# Patient Record
Sex: Male | Born: 1960 | Race: Black or African American | Hispanic: No | Marital: Married | Smoking: Never smoker
Health system: Southern US, Community
[De-identification: ages and names within clinical notes are randomized; demographics above are authoritative.]

## PROBLEM LIST (undated history)

## (undated) DIAGNOSIS — F32A Depression, unspecified: Secondary | ICD-10-CM

## (undated) DIAGNOSIS — F329 Major depressive disorder, single episode, unspecified: Secondary | ICD-10-CM

## (undated) DIAGNOSIS — G8929 Other chronic pain: Secondary | ICD-10-CM

## (undated) DIAGNOSIS — K5900 Constipation, unspecified: Secondary | ICD-10-CM

## (undated) DIAGNOSIS — M549 Dorsalgia, unspecified: Secondary | ICD-10-CM

## (undated) DIAGNOSIS — G43909 Migraine, unspecified, not intractable, without status migrainosus: Secondary | ICD-10-CM

## (undated) DIAGNOSIS — R591 Generalized enlarged lymph nodes: Principal | ICD-10-CM

## (undated) DIAGNOSIS — K219 Gastro-esophageal reflux disease without esophagitis: Secondary | ICD-10-CM

## (undated) HISTORY — DX: Generalized enlarged lymph nodes: R59.1

## (undated) HISTORY — PX: NO PAST SURGERIES: SHX2092

## (undated) HISTORY — DX: Migraine, unspecified, not intractable, without status migrainosus: G43.909

---

## 2012-06-19 ENCOUNTER — Encounter: Payer: Self-pay | Admitting: Family Medicine

## 2012-06-19 ENCOUNTER — Ambulatory Visit (INDEPENDENT_AMBULATORY_CARE_PROVIDER_SITE_OTHER): Payer: PRIVATE HEALTH INSURANCE | Admitting: Family Medicine

## 2012-06-19 VITALS — BP 126/74 | HR 80 | Resp 18 | Ht 69.0 in | Wt 151.1 lb

## 2012-06-19 DIAGNOSIS — Z13220 Encounter for screening for lipoid disorders: Secondary | ICD-10-CM

## 2012-06-19 DIAGNOSIS — G43909 Migraine, unspecified, not intractable, without status migrainosus: Secondary | ICD-10-CM

## 2012-06-19 DIAGNOSIS — N289 Disorder of kidney and ureter, unspecified: Secondary | ICD-10-CM

## 2012-06-19 NOTE — Patient Instructions (Signed)
MRI to be set up Labs to be done fasting- we will call with lab results I will get the records from your previous specialist  Schedule Physical Exam in 6 month

## 2012-06-19 NOTE — Progress Notes (Signed)
  Subjective:    Patient ID: Brandon [name], male    DOB: 05/29/1961, 51 y.o.   MRN: [id number]  HPI Pt here to establish care, no previous PCP Medications and history reviewed Patient has a history of migraine disorder. He also suffers with chronic neck and back pain. [facility] is [facility]. He has tried multiple CAM modalities as well as trigger point injections, epidural injections into the neck and lower back and currently on medications. He was trying to get Botox injections however this was not approved and he was asked to have an MRI of the brain which she has not had. His headaches are still uncontrolled and he finds this to be very worrisome. He feels a day or worsening and they are interfering with his life. His back pain was treated by Dr. Eduard Clos, neck pain by orthopedic surgeon Dr. Shon Baton He has not had lab studies   Review of Systems  GEN- denies fatigue, fever, weight loss,weakness, recent illness HEENT- denies eye drainage, change in vision, nasal discharge, CVS- denies chest pain, palpitations RESP- denies SOB, cough, wheeze ABD- denies N/V, change in stools, abd pain GU- denies dysuria, hematuria, dribbling, incontinence MSK- +joint pain, muscle aches, injury Neuro- + headache, dizziness, syncope, seizure activity      Objective:   Physical Exam GEN- NAD, alert and oriented x3 HEENT- PERRL, EOMI, non injected sclera, pink conjunctiva, MMM, oropharynx clear Neck- Supple,  CVS- RRR, no murmur RESP-CTAB ABD-NABS,soft,NT,ND EXT- No edema Pulses- Radial, DP- 2+ Neuro- CNII-XII grossly in tact, no focal deficits       Assessment & Plan:

## 2012-06-23 DIAGNOSIS — G43909 Migraine, unspecified, not intractable, without status migrainosus: Secondary | ICD-10-CM | POA: Insufficient documentation

## 2012-06-23 LAB — CBC WITH DIFFERENTIAL/PLATELET
Basophils Absolute: 0 10*3/uL (ref 0.0–0.1)
Lymphocytes Relative: 51 % — ABNORMAL HIGH (ref 12–46)
Neutro Abs: 1.3 10*3/uL — ABNORMAL LOW (ref 1.7–7.7)
Neutrophils Relative %: 39 % — ABNORMAL LOW (ref 43–77)
Platelets: 308 10*3/uL (ref 150–400)
RDW: 11.8 % (ref 11.5–15.5)
WBC: 3.3 10*3/uL — ABNORMAL LOW (ref 4.0–10.5)

## 2012-06-23 LAB — COMPREHENSIVE METABOLIC PANEL
ALT: 21 U/L (ref 0–53)
Albumin: 4.9 g/dL (ref 3.5–5.2)
CO2: 26 mEq/L (ref 19–32)
Calcium: 10 mg/dL (ref 8.4–10.5)
Chloride: 106 mEq/L (ref 96–112)
Creat: 1.55 mg/dL — ABNORMAL HIGH (ref 0.50–1.35)
Sodium: 139 mEq/L (ref 135–145)
Total Protein: 7.7 g/dL (ref 6.0–8.3)

## 2012-06-23 LAB — LIPID PANEL
Cholesterol: 225 mg/dL — ABNORMAL HIGH (ref 0–200)
Triglycerides: 93 mg/dL (ref <150)

## 2012-06-23 NOTE — Assessment & Plan Note (Signed)
I will obtain records from the headache clinic. He has not been followed by primary care physician has not had any blood work done. MRI of the brain will also be done as needed for his insurance purposes for further treatment Records to be obtained

## 2012-07-24 ENCOUNTER — Telehealth: Payer: Self-pay

## 2012-07-24 LAB — BASIC METABOLIC PANEL
BUN: 13 mg/dL (ref 6–23)
CO2: 27 mEq/L (ref 19–32)
Calcium: 9.8 mg/dL (ref 8.4–10.5)
Glucose, Bld: 79 mg/dL (ref 70–99)
Potassium: 4.3 mEq/L (ref 3.5–5.3)

## 2012-07-24 LAB — CBC
HCT: 47.2 % (ref 39.0–52.0)
Hemoglobin: 16.5 g/dL (ref 13.0–17.0)
MCH: 31 pg (ref 26.0–34.0)
MCHC: 35 g/dL (ref 30.0–36.0)
RBC: 5.33 MIL/uL (ref 4.22–5.81)

## 2012-07-24 NOTE — Addendum Note (Signed)
Addended by: Kandis Fantasia B on: 07/24/2012 03:57 PM   Modules accepted: Orders

## 2012-07-25 LAB — URINALYSIS
Glucose, UA: NEGATIVE mg/dL
Hgb urine dipstick: NEGATIVE
Ketones, ur: NEGATIVE mg/dL
Leukocytes, UA: NEGATIVE
Protein, ur: NEGATIVE mg/dL

## 2012-08-14 ENCOUNTER — Other Ambulatory Visit: Payer: Self-pay | Admitting: Family Medicine

## 2012-08-14 DIAGNOSIS — G43909 Migraine, unspecified, not intractable, without status migrainosus: Secondary | ICD-10-CM

## 2012-08-15 NOTE — Telephone Encounter (Signed)
Scheduled for 10-23

## 2012-08-16 ENCOUNTER — Ambulatory Visit (HOSPITAL_COMMUNITY)
Admission: RE | Admit: 2012-08-16 | Discharge: 2012-08-16 | Disposition: A | Discharge status: Home / Self Care | Payer: PRIVATE HEALTH INSURANCE | Source: Ambulatory Visit | Attending: Family Medicine | Admitting: Family Medicine

## 2012-08-16 DIAGNOSIS — R51 Headache: Secondary | ICD-10-CM | POA: Insufficient documentation

## 2012-08-16 DIAGNOSIS — G43909 Migraine, unspecified, not intractable, without status migrainosus: Secondary | ICD-10-CM

## 2012-10-16 ENCOUNTER — Telehealth: Payer: Self-pay | Admitting: Family Medicine

## 2012-10-16 NOTE — Telephone Encounter (Signed)
Called pt and no answer.  

## 2012-10-19 ENCOUNTER — Encounter: Payer: Self-pay | Admitting: Family Medicine

## 2012-10-19 ENCOUNTER — Ambulatory Visit (INDEPENDENT_AMBULATORY_CARE_PROVIDER_SITE_OTHER): Payer: PRIVATE HEALTH INSURANCE | Admitting: Family Medicine

## 2012-10-19 VITALS — BP 104/82 | HR 69 | Resp 16 | Wt 144.1 lb

## 2012-10-19 DIAGNOSIS — E785 Hyperlipidemia, unspecified: Secondary | ICD-10-CM

## 2012-10-19 DIAGNOSIS — Z0000 Encounter for general adult medical examination without abnormal findings: Secondary | ICD-10-CM

## 2012-10-19 DIAGNOSIS — R3129 Other microscopic hematuria: Secondary | ICD-10-CM

## 2012-10-19 DIAGNOSIS — R109 Unspecified abdominal pain: Secondary | ICD-10-CM

## 2012-10-19 DIAGNOSIS — Z1211 Encounter for screening for malignant neoplasm of colon: Secondary | ICD-10-CM

## 2012-10-19 DIAGNOSIS — N289 Disorder of kidney and ureter, unspecified: Secondary | ICD-10-CM

## 2012-10-19 DIAGNOSIS — R319 Hematuria, unspecified: Secondary | ICD-10-CM

## 2012-10-19 DIAGNOSIS — Z125 Encounter for screening for malignant neoplasm of prostate: Secondary | ICD-10-CM

## 2012-10-19 LAB — COMPREHENSIVE METABOLIC PANEL
Albumin: 5.1 g/dL (ref 3.5–5.2)
BUN: 9 mg/dL (ref 6–23)
Calcium: 10.6 mg/dL — ABNORMAL HIGH (ref 8.4–10.5)
Chloride: 101 mEq/L (ref 96–112)
Glucose, Bld: 82 mg/dL (ref 70–99)
Potassium: 4.3 mEq/L (ref 3.5–5.3)
Total Protein: 8 g/dL (ref 6.0–8.3)

## 2012-10-19 LAB — CBC
MCHC: 35.1 g/dL (ref 30.0–36.0)
MCV: 89.1 fL (ref 78.0–100.0)
Platelets: 323 10*3/uL (ref 150–400)
RDW: 12 % (ref 11.5–15.5)
WBC: 4.7 10*3/uL (ref 4.0–10.5)

## 2012-10-19 LAB — POCT URINALYSIS DIPSTICK
Glucose, UA: NEGATIVE
Nitrite, UA: NEGATIVE
Protein, UA: NEGATIVE
Urobilinogen, UA: 0.2

## 2012-10-19 LAB — ESTIMATED GFR: GFR, Est Non African American: 64 mL/min

## 2012-10-19 MED ORDER — TRAMADOL HCL 50 MG PO TABS: 50.0000 mg | ORAL_TABLET | Freq: Three times a day (TID) | ORAL | Status: DC | PRN

## 2012-10-19 NOTE — Progress Notes (Signed)
  Subjective:    Patient ID: Brandon [name], male    DOB: 30-May-1961, 50 y.o.   MRN: [id number]  HPI Pt here for CPE. Abdominal pain on and off past week, felt mostly in lower quadrants, denies N/V, diarrhea, has not slept well because of severe pain at night. Denies dysuria. No new medications, [facility] still TDAP 2012   Review of Systems  GEN- denies fatigue, fever, weight loss,weakness, recent illness HEENT- denies eye drainage, change in vision, nasal discharge, CVS- denies chest pain, palpitations RESP- denies SOB, cough, wheeze ABD- denies N/V, change in stools, +abd pain GU- denies dysuria, hematuria, dribbling, incontinence MSK- denies joint pain, muscle aches, injury Neuro- denies headache, dizziness, syncope, seizure activity      Objective:   Physical Exam GEN- NAD, alert and oriented x3 HEENT- PERRL, EOMI, non injected sclera, pink conjunctiva, MMM, oropharynx clear Neck- Supple, no thryomegaly CVS- RRR, no murmur RESP-CTAB ABD-NABS,soft,TTP lower quadrants, no rebound, no guarding,ND GU- normal external genitalia, no hernia seen, FOBT neg, normal rectal tone, smooth prostate EXT- No edema Pulses- Radial, DP- 2+ Psych- anxious appearing       Assessment & Plan:   CPE- refer to GI for colonoscopy, Immunizations UTD, recommend dental q 6 months, eye exam yearly  risk and benefits of prostate screening discussed

## 2012-10-19 NOTE — Assessment & Plan Note (Signed)
CT abd/pelvis due to severe pain and mild hematuria, check labs Ultram given for pain Referral to GI for colonoscopy

## 2012-10-19 NOTE — Assessment & Plan Note (Signed)
Lifestyle changes with diet,recheck in 3 months

## 2012-10-19 NOTE — Assessment & Plan Note (Signed)
Recheck Cr. 

## 2012-10-19 NOTE — Telephone Encounter (Signed)
Pt in for OV today.

## 2012-10-19 NOTE — Patient Instructions (Signed)
CT scan to be done of your abdomen Referral to GI for colonoscopy  Continue current medications Get the labs done today

## 2012-10-21 ENCOUNTER — Encounter (HOSPITAL_COMMUNITY): Payer: Self-pay

## 2012-10-21 ENCOUNTER — Emergency Department (HOSPITAL_COMMUNITY)
Admission: EM | Admit: 2012-10-21 | Discharge: 2012-10-21 | Disposition: A | Discharge status: Home / Self Care | Payer: PRIVATE HEALTH INSURANCE | Attending: Emergency Medicine | Admitting: Emergency Medicine

## 2012-10-21 ENCOUNTER — Encounter: Payer: Self-pay | Admitting: Family Medicine

## 2012-10-21 DIAGNOSIS — G479 Sleep disorder, unspecified: Secondary | ICD-10-CM | POA: Insufficient documentation

## 2012-10-21 DIAGNOSIS — R55 Syncope and collapse: Secondary | ICD-10-CM | POA: Insufficient documentation

## 2012-10-21 DIAGNOSIS — M549 Dorsalgia, unspecified: Secondary | ICD-10-CM | POA: Insufficient documentation

## 2012-10-21 DIAGNOSIS — G43909 Migraine, unspecified, not intractable, without status migrainosus: Secondary | ICD-10-CM | POA: Insufficient documentation

## 2012-10-21 DIAGNOSIS — K219 Gastro-esophageal reflux disease without esophagitis: Secondary | ICD-10-CM | POA: Insufficient documentation

## 2012-10-21 DIAGNOSIS — G8929 Other chronic pain: Secondary | ICD-10-CM | POA: Insufficient documentation

## 2012-10-21 DIAGNOSIS — Z8659 Personal history of other mental and behavioral disorders: Secondary | ICD-10-CM | POA: Insufficient documentation

## 2012-10-21 DIAGNOSIS — Z79899 Other long term (current) drug therapy: Secondary | ICD-10-CM | POA: Insufficient documentation

## 2012-10-21 HISTORY — DX: Gastro-esophageal reflux disease without esophagitis: K21.9

## 2012-10-21 HISTORY — DX: Other chronic pain: G89.29

## 2012-10-21 HISTORY — DX: Dorsalgia, unspecified: M54.9

## 2012-10-21 HISTORY — DX: Depression, unspecified: F32.A

## 2012-10-21 HISTORY — DX: Major depressive disorder, single episode, unspecified: F32.9

## 2012-10-21 MED ORDER — ZOLPIDEM TARTRATE 5 MG PO TABS: 5.0000 mg | ORAL_TABLET | Freq: Every evening | ORAL | Status: DC | PRN
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
## 2012-10-21 NOTE — ED Notes (Signed)
1. Pt reports back pain for 2 years, had pain yesterday and was seen by pmd yesterday for same.  2. Has not eaten "trying to clean self out" before colonoscopy.  Thinks he is constipated, last bm yesterday. 3.  Near syncope yesterday. Reports that he has been drinking a lot of water. Was told by "headache doctor to drink lots of water"

## 2012-10-22 LAB — URINE CULTURE: Organism ID, Bacteria: NO GROWTH

## 2012-10-24 ENCOUNTER — Encounter: Payer: Self-pay | Admitting: Family Medicine

## 2012-10-25 ENCOUNTER — Telehealth: Payer: Self-pay | Admitting: Family Medicine

## 2012-10-25 NOTE — Telephone Encounter (Signed)
States he was seen by dentist told he has infection in his body, states his groin pain is So severe, and he has used the tramadol prescribed las week, not helping, I advised ed eval if pain is so severe, call pcp in am, no pain meds through telephone call

## 2012-10-26 ENCOUNTER — Telehealth: Payer: Self-pay | Admitting: Family Medicine

## 2012-10-26 NOTE — Telephone Encounter (Signed)
Patient has appointment for 1.6.14 at [facility] at 1:45 patient is aware

## 2012-10-26 NOTE — Telephone Encounter (Signed)
Please call pt and let him know if abdominal pain becomes severe then go to ER, he should have CT scan coming up  He can take 1 or 2 of the tramadol if needed, I do not want anything stronger because of his constipation right now

## 2012-10-30 ENCOUNTER — Telehealth: Payer: Self-pay | Admitting: Gastroenterology

## 2012-10-30 ENCOUNTER — Other Ambulatory Visit: Payer: Self-pay

## 2012-10-30 ENCOUNTER — Ambulatory Visit (HOSPITAL_COMMUNITY)
Admission: RE | Admit: 2012-10-30 | Discharge: 2012-10-30 | Disposition: A | Discharge status: Home / Self Care | Payer: PRIVATE HEALTH INSURANCE | Source: Ambulatory Visit | Attending: Family Medicine | Admitting: Family Medicine

## 2012-10-30 DIAGNOSIS — R109 Unspecified abdominal pain: Secondary | ICD-10-CM | POA: Insufficient documentation

## 2012-10-30 DIAGNOSIS — Z139 Encounter for screening, unspecified: Secondary | ICD-10-CM

## 2012-10-30 DIAGNOSIS — R319 Hematuria, unspecified: Secondary | ICD-10-CM | POA: Insufficient documentation

## 2012-10-30 NOTE — Telephone Encounter (Signed)
Gastroenterology Pre-Procedure Form    Request Date: 10/30/2012     Requesting Physician: Dr. Jeanice Lim     PATIENT INFORMATION:  Brandon [name] is a 52 y.o., male (DOB=Oct 12, 1961).  PROCEDURE: Procedure(s) requested: colonoscopy Procedure Reason: screening for colon cancer  PATIENT REVIEW QUESTIONS: The patient reports the following:   1. Diabetes Melitis: no 2. Joint replacements in the past 12 months: no 3. Major health problems in the past 3 months: no 4. Has an artificial valve or MVP:no 5. Has been advised in past to take antibiotics in advance of a procedure like teeth cleaning: no}    MEDICATIONS & ALLERGIES:    Patient reports the following regarding taking any blood thinners:   Plavix? no Aspirin?no Coumadin?  no  Patient confirms/reports the following medications:  Current Outpatient Prescriptions  Medication Sig Dispense Refill  . zolpidem (AMBIEN) 5 MG tablet Take 1 tablet (5 mg total) by mouth at bedtime as needed for sleep.  10 tablet  0    Patient confirms/reports the following allergies:  No Known Allergies  Patient is appropriate to schedule for requested procedure(s): yes  AUTHORIZATION INFORMATION Primary Insurance:   ID #:   Group #:  Pre-Cert / Auth required:  Pre-Cert / Auth #:   Secondary Insurance:   ID #:  Group #:  Pre-Cert / Auth required:  Pre-Cert / Auth #:   No orders of the defined types were placed in this encounter.    SCHEDULE INFORMATION: Procedure has been scheduled as follows:  Date: 11/06/2012     Time: 1:15 PM  Location: [facility] Short Stay  This Gastroenterology Pre-Precedure Form is being routed to the following provider(s) for review: Jonette Eva, MD

## 2012-10-30 NOTE — Telephone Encounter (Signed)
Please return patient's call at [phone]. He had received a letter from the nurse for a triage.

## 2012-10-30 NOTE — Telephone Encounter (Signed)
MOVI PREP SPLIT DOSING, REGULAR BREAKFAST. CLEAR LIQUIDS AFTER 9 AM.  

## 2012-10-31 ENCOUNTER — Encounter: Payer: Self-pay | Admitting: Family Medicine

## 2012-10-31 ENCOUNTER — Encounter (HOSPITAL_COMMUNITY): Payer: Self-pay | Admitting: Pharmacy Technician

## 2012-10-31 MED ORDER — PEG-KCL-NACL-NASULF-NA ASC-C 100 G PO SOLR: 1.0000 | ORAL | Status: DC

## 2012-10-31 NOTE — Telephone Encounter (Signed)
Rx sent to [facility]. Pt will come by office to pick up instructions.

## 2012-11-06 ENCOUNTER — Encounter (HOSPITAL_COMMUNITY)
Admission: RE | Disposition: A | Discharge status: Home / Self Care | Payer: Self-pay | Source: Ambulatory Visit | Attending: Gastroenterology

## 2012-11-06 ENCOUNTER — Ambulatory Visit (HOSPITAL_COMMUNITY)
Admission: RE | Admit: 2012-11-06 | Discharge: 2012-11-06 | Disposition: A | Discharge status: Home / Self Care | Payer: PRIVATE HEALTH INSURANCE | Source: Ambulatory Visit | Attending: Gastroenterology | Admitting: Gastroenterology

## 2012-11-06 ENCOUNTER — Encounter (HOSPITAL_COMMUNITY): Payer: Self-pay | Admitting: *Deleted

## 2012-11-06 DIAGNOSIS — Z139 Encounter for screening, unspecified: Secondary | ICD-10-CM

## 2012-11-06 DIAGNOSIS — Z1211 Encounter for screening for malignant neoplasm of colon: Secondary | ICD-10-CM | POA: Insufficient documentation

## 2012-11-06 DIAGNOSIS — K648 Other hemorrhoids: Secondary | ICD-10-CM | POA: Insufficient documentation

## 2012-11-06 DIAGNOSIS — K5730 Diverticulosis of large intestine without perforation or abscess without bleeding: Secondary | ICD-10-CM

## 2012-11-06 HISTORY — PX: COLONOSCOPY: SHX5424

## 2012-11-06 SURGERY — COLONOSCOPY
Anesthesia: Moderate Sedation

## 2012-11-06 MED ORDER — MIDAZOLAM HCL 5 MG/5ML IJ SOLN
INTRAMUSCULAR | Status: DC | PRN
  Administered 2012-11-06 (×2): 2 mg via INTRAVENOUS
  Administered 2012-11-06: 1 mg via INTRAVENOUS

## 2012-11-06 MED ORDER — MEPERIDINE HCL 100 MG/ML IJ SOLN
INTRAMUSCULAR | Status: AC
  Filled 2012-11-06: qty 2

## 2012-11-06 MED ORDER — MEPERIDINE HCL 100 MG/ML IJ SOLN
INTRAMUSCULAR | Status: DC | PRN
  Administered 2012-11-06: 50 mg via INTRAVENOUS
  Administered 2012-11-06 (×2): 25 mg via INTRAVENOUS

## 2012-11-06 MED ORDER — SODIUM CHLORIDE 0.45 % IV SOLN
INTRAVENOUS | Status: DC
  Administered 2012-11-06: 10:00:00 via INTRAVENOUS

## 2012-11-06 MED ORDER — MIDAZOLAM HCL 5 MG/5ML IJ SOLN
INTRAMUSCULAR | Status: AC
  Filled 2012-11-06: qty 10

## 2012-11-06 MED ORDER — STERILE WATER FOR IRRIGATION IR SOLN
Status: DC | PRN
  Administered 2012-11-06: 11:00:00

## 2012-11-06 NOTE — Op Note (Signed)
[facility] [address] [place], [address]   COLONOSCOPY PROCEDURE REPORT  PATIENT: Brandon [name], Brandon [name]  MR#: [id number] BIRTHDATE: Mar 03, 1961 , 52  yrs. old GENDER: Male ENDOSCOPIST: Jonette Eva, MD REFERRED BM:[id number] [place], M.D. PROCEDURE DATE:  11/06/2012 PROCEDURE:   Colonoscopy, screening INDICATIONS:Average risk patient for colon cancer. MEDICATIONS: Demerol 100 mg IV and Versed 5 mg IV  DESCRIPTION OF PROCEDURE:    Physical exam was performed.  Informed consent was obtained from the patient after explaining the benefits, risks, and alternatives to procedure.  The patient was connected to monitor and placed in left lateral position. Continuous oxygen was provided by nasal cannula and IV medicine administered through an indwelling cannula.  After administration of sedation and rectal exam, the patients rectum was intubated and the Pentax Colonoscope [id number]  colonoscope was advanced under direct visualization to the cecum.  The scope was removed slowly by carefully examining the color, texture, anatomy, and integrity mucosa on the way out.  The patient was recovered in endoscopy and discharged home in satisfactory condition.    COLON FINDINGS: Mild diverticulosis was noted in the sigmoid colon. , The colon mucosa was otherwise normal.  , and Small internal hemorrhoids were found.  PREP QUALITY: excellent. CECAL W/D TIME: 11 minutes COMPLICATIONS: None  ENDOSCOPIC IMPRESSION: 1.   Mild diverticulosis was noted in the sigmoid colon 2.   The colon mucosa was otherwise normal 3.   Small internal hemorrhoids  RECOMMENDATIONS: HIGH FIBER DIET TCS IN 10 YEARS       _______________________________ eSignedJonette Eva, MD 11/06/2012 12:02 PM

## 2012-11-06 NOTE — H&P (Signed)
  Primary Care Physician:  Milinda Antis, MD Primary Gastroenterologist:  Dr. Darrick Penna  Pre-Procedure History & Physical: HPI:  Brandon [name] is a 52 y.o. male here for COLON CANCER SCREENING.  Past Medical History  Diagnosis Date  . Migraines   . Chronic back pain   . Depression   . Acid reflux     Past Surgical History  Procedure Date  . No past surgeries     Prior to Admission medications   Medication Sig Start Date End Date Taking? Authorizing Provider  Flaxseed, Linseed, (FLAXSEED OIL PO) Take 1 capsule by mouth 2 (two) times daily.   Yes Historical Provider, MD    Allergies as of 10/30/2012  . (No Known Allergies)    Family History  Problem Relation Age of Onset  . Diabetes Father   . Hypertension Brother   . Colon cancer Other     History   Social History  . Marital Status: Married    Spouse Name: N/A    Number of Children: N/A  . Years of Education: N/A   Occupational History  . Not on file.   Social History Main Topics  . Smoking status: Never Smoker   . Smokeless tobacco: Not on file  . Alcohol Use: No  . Drug Use: No  . Sexually Active: Yes    Birth Control/ Protection: None   Other Topics Concern  . Not on file   Social History Narrative  . No narrative on file    Review of Systems: See HPI, otherwise negative ROS   Physical Exam: Pulse 78  Temp 98.1 F (36.7 C) (Oral)  Resp 21  Ht 5\' 10"  (1.778 m)  Wt 150 lb (68.04 kg)  BMI 21.52 kg/m2  SpO2 98% General:   Alert,  pleasant and cooperative in NAD Head:  Normocephalic and atraumatic. Neck:  Supple; Lungs:  Clear throughout to auscultation.    Heart:  Regular rate and rhythm. Abdomen:  Soft, nontender and nondistended. Normal bowel sounds, without guarding, and without rebound.   Neurologic:  Alert and  oriented x4;  grossly normal neurologically.  Impression/Plan:     SCREENING  Plan:  1. TCS TODAY

## 2012-11-07 ENCOUNTER — Encounter: Payer: Self-pay | Admitting: Family Medicine

## 2012-11-08 ENCOUNTER — Encounter (HOSPITAL_COMMUNITY): Payer: Self-pay | Admitting: Gastroenterology

## 2012-11-08 ENCOUNTER — Encounter: Payer: Self-pay | Admitting: Family Medicine

## 2012-11-30 ENCOUNTER — Telehealth: Payer: Self-pay | Admitting: Gastroenterology

## 2012-11-30 NOTE — Telephone Encounter (Signed)
Pt called to ask when his next OV was to follow up from his tcs from January. I told him he was due next tcs in 10 years and no mention of a follow up unless he was having problems. He said he went to his dentist and was told that his acid reflux had damaged his teeth. Patient is asking should he follow up with his PCP or make OV with [place]. I told him that I would ask SF to see what she advises and I would call him back at [phone]

## 2012-12-01 ENCOUNTER — Encounter: Payer: Self-pay | Admitting: Gastroenterology

## 2012-12-01 MED ORDER — OMEPRAZOLE 20 MG PO CPDR: DELAYED_RELEASE_CAPSULE | ORAL | Status: DC

## 2012-12-01 NOTE — Telephone Encounter (Signed)
ERROR

## 2012-12-01 NOTE — Telephone Encounter (Signed)
SCHEDULE OPV W/ SLF E30 DX: GERD. PT NEEDS PRILOSEC 20 MG 30 MINUTES PRIOR TO MEALS TWICE DAILY.

## 2012-12-04 NOTE — Telephone Encounter (Signed)
LMOM to call.

## 2012-12-04 NOTE — Telephone Encounter (Signed)
Pt returned call and was informed. He will check his schedule and call to schedule ov appt.

## 2012-12-11 ENCOUNTER — Telehealth: Payer: Self-pay | Admitting: Gastroenterology

## 2012-12-11 NOTE — Telephone Encounter (Signed)
Pt has OV to see SF in E30 on 4/2 @ 3. Pt works in [place] and gets off at [employer]. Is there a chance you can work out a time later than 3pm to see him? Please advise. He would also like to be seen sooner than April.

## 2012-12-12 NOTE — Telephone Encounter (Signed)
Pt is aware of OV with SF on 3/13 at 330 in E30

## 2012-12-12 NOTE — Telephone Encounter (Signed)
PLEASE CALL PT.  CAN SEE HIM ON MAR 13 AT 330 PM E30 VISIT.

## 2012-12-19 NOTE — Telephone Encounter (Signed)
Pt is aware of OV °

## 2012-12-21 ENCOUNTER — Ambulatory Visit (INDEPENDENT_AMBULATORY_CARE_PROVIDER_SITE_OTHER): Payer: PRIVATE HEALTH INSURANCE | Admitting: Family Medicine

## 2012-12-21 ENCOUNTER — Encounter: Payer: Self-pay | Admitting: Family Medicine

## 2012-12-21 VITALS — BP 128/70 | HR 80 | Resp 18 | Ht 69.0 in | Wt 139.1 lb

## 2012-12-21 DIAGNOSIS — K219 Gastro-esophageal reflux disease without esophagitis: Secondary | ICD-10-CM

## 2012-12-21 DIAGNOSIS — E785 Hyperlipidemia, unspecified: Secondary | ICD-10-CM

## 2012-12-21 DIAGNOSIS — N289 Disorder of kidney and ureter, unspecified: Secondary | ICD-10-CM

## 2012-12-21 NOTE — Progress Notes (Signed)
  Subjective:    Patient ID: Brandon [name], male    DOB: 13-May-1961, 52 y.o.   MRN: [id number]  HPI  Patient here follow chronic medical problems. He has no specific concerns or states that his dentist told him that he had a lot of bacteria in his mouth and down his throat as well as acid reflux he denies any reflux symptoms. He's been very diligent with his high fiber diet and his flaxseed in his bowels at the living will he does not getting bloating and does not have any excessive belching. Of note he also called the gastroenterologist and was told to start Prilosec twice a day however he did not start this at the time. He has a followup scheduled in March. Due for repeat cholesterol  Review of Systems  GEN- denies fatigue, fever, weight loss,weakness, recent illness HEENT- denies eye drainage, change in vision, nasal discharge, CVS- denies chest pain, palpitations RESP- denies SOB, cough, wheeze ABD- denies N/V, change in stools, abd pain GU- denies dysuria, hematuria, dribbling, incontinence MSK- denies joint pain, muscle aches, injury Neuro- denies headache, dizziness, syncope, seizure activity      Objective:   Physical Exam  GEN- NAD, alert and oriented x3 HEENT- PERRL, EOMI, non injected sclera, pink conjunctiva, MMM, oropharynx clear CVS- RRR, no murmur RESP-CTAB ABD-NABS,soft,NT,ND Pulses- Radial 2+       Assessment & Plan:

## 2012-12-21 NOTE — Patient Instructions (Signed)
Keep GI appt Get the labs done fasting We will call with results unless everything is normal  F/U 6 months

## 2012-12-22 DIAGNOSIS — K219 Gastro-esophageal reflux disease without esophagitis: Secondary | ICD-10-CM | POA: Insufficient documentation

## 2012-12-22 LAB — BASIC METABOLIC PANEL
Chloride: 104 mEq/L (ref 96–112)
Glucose, Bld: 87 mg/dL (ref 70–99)
Potassium: 4.6 mEq/L (ref 3.5–5.3)
Sodium: 140 mEq/L (ref 135–145)

## 2012-12-22 LAB — ESTIMATED GFR
GFR, Est African American: >89 mL/min
GFR, Est Non African American: 79 mL/min

## 2012-12-22 LAB — LIPID PANEL
Total CHOL/HDL Ratio: 2.4 Ratio
VLDL: 14 mg/dL (ref 0–40)

## 2012-12-22 LAB — CBC
Hemoglobin: 14.6 g/dL (ref 13.0–17.0)
RBC: 4.68 MIL/uL (ref 4.22–5.81)

## 2012-12-22 NOTE — Assessment & Plan Note (Signed)
Very  interesting he complains of his acid reflux found by his dentist because of his evaluation. He does not have any acute symptoms and has not started the PPI. He will follow through with gastroenterology as a scheduled

## 2012-12-22 NOTE — Assessment & Plan Note (Signed)
Renal function is back to normal his liver function is also normal

## 2012-12-22 NOTE — Assessment & Plan Note (Signed)
Lipid panel rechecked with his change in diet his panel is normal

## 2013-01-03 ENCOUNTER — Encounter: Payer: Self-pay | Admitting: Gastroenterology

## 2013-01-03 ENCOUNTER — Ambulatory Visit: Payer: PRIVATE HEALTH INSURANCE | Admitting: Gastroenterology

## 2013-01-04 ENCOUNTER — Ambulatory Visit (INDEPENDENT_AMBULATORY_CARE_PROVIDER_SITE_OTHER): Payer: PRIVATE HEALTH INSURANCE | Admitting: Gastroenterology

## 2013-01-04 ENCOUNTER — Other Ambulatory Visit: Payer: Self-pay | Admitting: Gastroenterology

## 2013-01-04 ENCOUNTER — Encounter: Payer: Self-pay | Admitting: Gastroenterology

## 2013-01-04 VITALS — BP 109/66 | HR 82 | Temp 98.3°F | Ht 70.0 in | Wt 140.4 lb

## 2013-01-04 DIAGNOSIS — R1310 Dysphagia, unspecified: Secondary | ICD-10-CM | POA: Insufficient documentation

## 2013-01-04 DIAGNOSIS — K219 Gastro-esophageal reflux disease without esophagitis: Secondary | ICD-10-CM

## 2013-01-04 DIAGNOSIS — R1314 Dysphagia, pharyngoesophageal phase: Secondary | ICD-10-CM

## 2013-01-04 NOTE — Progress Notes (Signed)
  Subjective:    Patient ID: Brandon [name], male    DOB: 05/25/1961, 52 y.o.   MRN: [id number]  PCP: Jeanice Lim  HPI Having problems with soliD food. Wsa constipated but now doing flax seed and it helps out a whole lot. Had a lot of bacteria and infection. Dentist thout the enamel was eroding off his teeth due to acid. Pt denies heartburn or indigestion but used. Never changed Prilosec. Now eating better and using his MULTI-bullet. With diet modification, his reflux got better. HAS SEEN [facility] WHEN HE WAS CONSTIPATED. LOST 11 LBS SINCE AUG 2013. APPETITE: [facility]  PT DENIES FEVER, CHILLS, nausea, vomiting, melena, diarrhea, abd pain, problems with sedation, heartburn or indigestion. 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 NOW LIVES WITH HIS FATHER.   Past Medical History  Diagnosis Date  . Migraines   . Chronic back pain   . Depression   . Acid reflux    Past Surgical History  Procedure Laterality Date  . No past surgeries    . Colonoscopy  11/06/2012    JYN:WGNF diverticulosis/Small internal hemorrhoids otherwise normal   No Known Allergies  Current Outpatient Prescriptions  Medication Sig Dispense Refill  . Flaxseed, Linseed, (FLAXSEED OIL PO) Take 1 capsule by mouth 2 (two) times daily.      .          Review of Systems     Objective:   Physical Exam  Vitals reviewed. Constitutional: He is oriented to person, place, and time. He appears well-nourished. No distress.  HENT:  Head: Normocephalic and atraumatic.  Mouth/Throat: Oropharynx is clear and moist. No oropharyngeal exudate.  Eyes: Pupils are equal, round, and reactive to light. No scleral icterus.  Neck: Normal range of motion. Neck supple.  Cardiovascular: Normal rate, regular rhythm and normal heart sounds.   Pulmonary/Chest: Effort normal and breath sounds normal. No respiratory distress.  Abdominal: Soft. Bowel sounds are normal. He exhibits no distension. There is no tenderness.  Musculoskeletal: Normal range of motion. He  exhibits no edema.  Lymphadenopathy:    He has no cervical adenopathy.  Neurological: He is alert and oriented to person, place, and time.  NO FOCAL DEFICITS   Psychiatric: He has a normal mood and affect.          Assessment & Plan:

## 2013-01-04 NOTE — Progress Notes (Signed)
Faxed to PCP

## 2013-01-04 NOTE — Assessment & Plan Note (Addendum)
SOLDI SONLY. 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 MAY HAVE A CLEAR LIQUID BREAKFAST. NO LIQUIDS AFTER 0730 ON DAY OF PROCEDURE. DISCUSSED THE BENEFITS V. RISKS OF EGD. OPV IN 4 MOS.

## 2013-01-04 NOTE — Patient Instructions (Signed)
AVOID HIGH FAT FOODS & THINGS THAT TRIGGER REFLUX.  UPPER ENDOSCOPY MAR 25. NOTHING SOLID AFTER MIDNIGHT. YOU MAY HAVE LIQUIDS UNTIL 0730 ON THE DAY OF YOUR PROCEDURE.  FOLLOW UP IN 4 MOS.   Lifestyle and home remedies You may eliminate or reduce the frequency of heartburn by making the following lifestyle changes:    Control your weight. Being overweight is a major risk factor for heartburn and GERD. Excess pounds put pressure on your abdomen, pushing up your stomach and causing acid to back up into your esophagus.     Eat smaller meals. 4 TO 6 MEALS A DAY. This reduces pressure on the lower esophageal sphincter, helping to prevent the valve from opening and acid from washing back into your esophagus.     Loosen your belt. Clothes that fit tightly around your waist put pressure on your abdomen and the lower esophageal sphincter.    Eliminate heartburn triggers. Everyone has specific triggers.     Common triggers such as fatty or fried foods, spicy food, tomato sauce, carbonated beverages, alcohol, chocolate, mint, garlic, onion, caffeine and nicotine may make heartburn worse.     Avoid stooping or bending. Tying your shoes is OK. Bending over for longer periods to weed your garden isn't, especially soon after eating.     Don't lie down after a meal. Wait at least three to four hours after eating before going to bed, and don't lie down right after eating.   Alternative medicine   Several home remedies exist for treating GERD, but they provide only temporary relief. They include drinking baking soda (sodium bicarbonate) added to water or drinking other fluids such as baking soda mixed with cream of tartar and water.   Although these liquids create temporary relief by neutralizing, washing away or buffering acids, eventually they aggravate the situation by adding gas and fluid to your stomach, increasing pressure and causing more acid reflux. Further, adding more sodium to your diet may  increase your blood pressure and add stress to your heart, and excessive bicarbonate ingestion can alter the acid-base balance in your body.

## 2013-01-04 NOTE — Assessment & Plan Note (Signed)
SX CONTROLLED WITH DIET.  LOW FAT DIET. PRILOSEC AS NEEDED.

## 2013-01-12 ENCOUNTER — Encounter (HOSPITAL_COMMUNITY): Payer: Self-pay | Admitting: Pharmacy Technician

## 2013-01-12 NOTE — Progress Notes (Signed)
Reminder in epic °

## 2013-01-16 ENCOUNTER — Ambulatory Visit (HOSPITAL_COMMUNITY)
Admission: RE | Admit: 2013-01-16 | Discharge: 2013-01-16 | Disposition: A | Discharge status: Home / Self Care | Payer: PRIVATE HEALTH INSURANCE | Source: Ambulatory Visit | Attending: Gastroenterology | Admitting: Gastroenterology

## 2013-01-16 ENCOUNTER — Encounter (HOSPITAL_COMMUNITY): Payer: Self-pay | Admitting: *Deleted

## 2013-01-16 ENCOUNTER — Encounter (HOSPITAL_COMMUNITY)
Admission: RE | Disposition: A | Discharge status: Home / Self Care | Payer: Self-pay | Source: Ambulatory Visit | Attending: Gastroenterology

## 2013-01-16 DIAGNOSIS — K219 Gastro-esophageal reflux disease without esophagitis: Secondary | ICD-10-CM

## 2013-01-16 DIAGNOSIS — K222 Esophageal obstruction: Secondary | ICD-10-CM | POA: Insufficient documentation

## 2013-01-16 DIAGNOSIS — R1314 Dysphagia, pharyngoesophageal phase: Secondary | ICD-10-CM

## 2013-01-16 DIAGNOSIS — R1310 Dysphagia, unspecified: Secondary | ICD-10-CM

## 2013-01-16 HISTORY — PX: ESOPHAGOGASTRODUODENOSCOPY (EGD) WITH ESOPHAGEAL DILATION: SHX5812

## 2013-01-16 SURGERY — ESOPHAGOGASTRODUODENOSCOPY (EGD) WITH ESOPHAGEAL DILATION
Anesthesia: Moderate Sedation

## 2013-01-16 MED ORDER — MIDAZOLAM HCL 5 MG/5ML IJ SOLN
INTRAMUSCULAR | Status: DC | PRN
  Administered 2013-01-16 (×2): 1 mg via INTRAVENOUS
  Administered 2013-01-16: 2 mg via INTRAVENOUS
  Administered 2013-01-16: 1 mg via INTRAVENOUS

## 2013-01-16 MED ORDER — MEPERIDINE HCL 100 MG/ML IJ SOLN
INTRAMUSCULAR | Status: DC | PRN
  Administered 2013-01-16 (×3): 25 mg via INTRAVENOUS

## 2013-01-16 MED ORDER — SODIUM CHLORIDE 0.9 % IV SOLN
INTRAVENOUS | Status: DC
  Administered 2013-01-16: 10:00:00 via INTRAVENOUS

## 2013-01-16 MED ORDER — MEPERIDINE HCL 100 MG/ML IJ SOLN
INTRAMUSCULAR | Status: AC
  Filled 2013-01-16: qty 1

## 2013-01-16 MED ORDER — MIDAZOLAM HCL 5 MG/5ML IJ SOLN
INTRAMUSCULAR | Status: AC
  Filled 2013-01-16: qty 5

## 2013-01-16 MED ORDER — OMEPRAZOLE 20 MG PO CPDR: DELAYED_RELEASE_CAPSULE | ORAL | Status: DC

## 2013-01-16 MED ORDER — BUTAMBEN-TETRACAINE-BENZOCAINE 2-2-14 % EX AERO
INHALATION_SPRAY | CUTANEOUS | Status: DC | PRN
  Administered 2013-01-16: 2 via TOPICAL

## 2013-01-16 NOTE — H&P (View-Only) (Signed)
  Subjective:    Patient ID: Brandon [name], male    DOB: May 31, 1961, 52 y.o.   MRN: [id number]  PCP: Jeanice Lim  HPI Having problems with soliD food. Wsa constipated but now doing flax seed and it helps out a whole lot. Had a lot of bacteria and infection. Dentist thout the enamel was eroding off his teeth due to acid. Pt denies heartburn or indigestion but used. Never changed Prilosec. Now eating better and using his MULTI-bullet. With diet modification, his reflux got better. HAS SEEN BRBPR WHEN HE WAS CONSTIPATED. LOST 11 LBS SINCE AUG 2013. APPETITE: [facility]  PT DENIES FEVER, CHILLS, nausea, vomiting, melena, diarrhea, abd pain, problems with sedation, heartburn or indigestion. 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 NOW LIVES WITH HIS FATHER.   Past Medical History  Diagnosis Date  . Migraines   . Chronic back pain   . Depression   . Acid reflux    Past Surgical History  Procedure Laterality Date  . No past surgeries    . Colonoscopy  11/06/2012    WUJ:WJXB diverticulosis/Small internal hemorrhoids otherwise normal   No Known Allergies  Current Outpatient Prescriptions  Medication Sig Dispense Refill  . Flaxseed, Linseed, (FLAXSEED OIL PO) Take 1 capsule by mouth 2 (two) times daily.      .          Review of Systems     Objective:   Physical Exam  Vitals reviewed. Constitutional: He is oriented to person, place, and time. He appears well-nourished. No distress.  HENT:  Head: Normocephalic and atraumatic.  Mouth/Throat: Oropharynx is clear and moist. No oropharyngeal exudate.  Eyes: Pupils are equal, round, and reactive to light. No scleral icterus.  Neck: Normal range of motion. Neck supple.  Cardiovascular: Normal rate, regular rhythm and normal heart sounds.   Pulmonary/Chest: Effort normal and breath sounds normal. No respiratory distress.  Abdominal: Soft. Bowel sounds are normal. He exhibits no distension. There is no tenderness.  Musculoskeletal: Normal range of motion. He  exhibits no edema.  Lymphadenopathy:    He has no cervical adenopathy.  Neurological: He is alert and oriented to person, place, and time.  NO FOCAL DEFICITS   Psychiatric: He has a normal mood and affect.          Assessment & Plan:

## 2013-01-16 NOTE — Op Note (Signed)
[facility] [address] [place], [address]   ENDOSCOPY PROCEDURE REPORT  PATIENT: Brandon, [name]  MR#: [id number] BIRTHDATE: June 07, 1961 , 52  yrs. old GENDER: Male  ENDOSCOPIST: Jonette Eva, MD REFERRED NG:EXBMWUX [place], M.D.  PROCEDURE DATE: 01/16/2013 PROCEDURE:   EGD w/ biopsy and Savary dilation of esophagus  INDICATIONS:solid dysphagia, PMHx: GERD-TAKING PPI PRN. MEDICATIONS: Demerol 75 mg IV and Versed 5 mg IV TOPICAL ANESTHETIC:   Cetacaine Spray  DESCRIPTION OF PROCEDURE:     Physical exam was performed.  Informed consent was obtained from the patient after explaining the benefits, risks, and alternatives to the procedure.  The patient was connected to the monitor and placed in the left lateral position.  Continuous oxygen was provided by nasal cannula and IV medicine administered through an indwelling cannula.  After administration of sedation, the patients esophagus was intubated and the EG-[id number] ([id number])  endoscope was advanced under direct visualization to the second portion of the duodenum.  The scope was removed slowly by carefully examining the color, texture, anatomy, and integrity of the mucosa on the way out.  The patient was recovered in endoscopy and discharged home in satisfactory condition.   ESOPHAGUS: A stricture was found at the gastroesophageal junction. The stenosis was traversable with the endoscope.   ESOPHAGUS DILATED USING 14 TO 16 MM SAVARY DILATOR WITH MILD TO MODERATE RESISTANCE. STOMACH: The mucosa of the stomach appeared normal. DUODENUM: The duodenal mucosa showed no abnormalities in the bulb and second portion of the duodenum.  COMPLICATIONS:   None  ENDOSCOPIC IMPRESSION: 1.   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  TAKE 30 MINUTES PRIOR TO YOUR FIRST MEAL. FOLLOW A LOW FAT DIET. BIOPSY WILL BE BACK IN 7  DAYS. FOLLOW UP IN July 2014. CONSIDER REPEAT EGD/DIL IF DYSPHAGIA PERSISTS.   REPEAT EXAM:   _______________________________ Rosalie DoctorJonette Eva, MD 01/16/2013 2:43 PM

## 2013-01-16 NOTE — Interval H&P Note (Signed)
History and Physical Interval Note:  01/16/2013 10:45 AM  Brandon [name]  has presented today for surgery, with the diagnosis of Dysphagia  The various methods of treatment have been discussed with the patient and family. After consideration of risks, benefits and other options for treatment, the patient has consented to  Procedure(s) with comments: ESOPHAGOGASTRODUODENOSCOPY (EGD) WITH ESOPHAGEAL DILATION (N/A) - 12:15-moved to 10:20am [name] to notify pt as a surgical intervention .  The patient's history has been reviewed, patient examined, no change in status, stable for surgery.  I have reviewed the patient's chart and labs.  Questions were answered to the patient's satisfaction.     [employer]

## 2013-01-19 ENCOUNTER — Encounter (HOSPITAL_COMMUNITY): Payer: Self-pay | Admitting: Gastroenterology

## 2013-01-24 ENCOUNTER — Ambulatory Visit: Payer: PRIVATE HEALTH INSURANCE | Admitting: Gastroenterology

## 2013-01-26 ENCOUNTER — Telehealth: Payer: Self-pay | Admitting: Gastroenterology

## 2013-01-26 NOTE — Telephone Encounter (Signed)
Forwarded to PCP.

## 2013-01-26 NOTE — Telephone Encounter (Signed)
PLEASE CALL PT. HIS ESOPHAGUS BIOPSIES SHOW IRRITATION FROM ACID REFLUX. THE STRICTURE IN HIS ESOPHAGUS IS FROM ACID DAMAGE AS WELL.   CONTINUE OMEPRAZOLE FOREVER. TAKE 30 MINUTES PRIOR TO HIS FIRST MEAL. DO NOT USE OMEPRAZOLE AS NEEDED.  FOLLOW A LOW FAT DIET.   FOLLOW UP IN July 2014.

## 2013-01-29 ENCOUNTER — Telehealth: Payer: Self-pay | Admitting: Gastroenterology

## 2013-01-29 NOTE — Telephone Encounter (Signed)
VM not set up. See phone/results note of 01/26/2013.

## 2013-01-29 NOTE — Telephone Encounter (Signed)
Called the phone number that he had left to call, [phone]. VM not set up and could not leave a message.

## 2013-01-29 NOTE — Telephone Encounter (Signed)
Tried to return pt's call. VM not set up. Could not leave a message.

## 2013-01-29 NOTE — Telephone Encounter (Signed)
Pt aware of results.  Darl Pikes   Can you please NIC for [facility]

## 2013-01-29 NOTE — Telephone Encounter (Signed)
Pt had LMOM that he was checking to see if his results were back yet. [phone]

## 2013-01-30 NOTE — Telephone Encounter (Signed)
appt reminder made for 04/2013

## 2013-05-24 ENCOUNTER — Ambulatory Visit (INDEPENDENT_AMBULATORY_CARE_PROVIDER_SITE_OTHER): Payer: PRIVATE HEALTH INSURANCE | Admitting: Gastroenterology

## 2013-05-24 ENCOUNTER — Encounter: Payer: Self-pay | Admitting: Gastroenterology

## 2013-05-24 VITALS — BP 107/68 | HR 81 | Temp 97.3°F | Ht 70.0 in | Wt 145.8 lb

## 2013-05-24 DIAGNOSIS — K219 Gastro-esophageal reflux disease without esophagitis: Secondary | ICD-10-CM

## 2013-05-24 DIAGNOSIS — R1310 Dysphagia, unspecified: Secondary | ICD-10-CM

## 2013-05-24 NOTE — Assessment & Plan Note (Signed)
RESOLVED.  LOW FAT DIET PRILOSEC DAILY OPV IN 6 MOS

## 2013-05-24 NOTE — Progress Notes (Signed)
  Subjective:    Patient ID: Brandon [name], male    DOB: [birth year], 52 y.o.   MRN: [id number] Milinda Antis, MD  HPI MAY NOTICE MUCOUS IN BACK OF HIS THROAT. NOTICES LATELY. CAN GET HOARSE FROM MUCOUS AND MAY BE UNABLE TO TALK. DRINKING GREEN TEA AND GINGER. NO ACID REFLUX, [employer] CONGESTION, OR POS [employer] DRAINAGE, OR REGURGITATION. SWALLOWING IS GREAT. HERBAL PILL MIGHT SIT AT BOTTOM OF ESOPHAGUS.TRYING TO MAKE BETTER HEALTH LIFE CHOICES. GAINED 5 LBS SINCE LAST VISIT.  Past Medical History  Diagnosis Date  . Migraines   . Chronic back pain   . Depression   . Acid reflux    Past Surgical History  Procedure Laterality Date       . Colonoscopy  11/06/2012    QMV:HQIO diverticulosis/Small internal hemorrhoids otherwise normal  . Esophagogastroduodenoscopy (egd) with esophageal dilation N/A 01/16/2013    Procedure: ESOPHAGOGASTRODUODENOSCOPY (EGD) WITH ESOPHAGEAL DILATION;  Surgeon: West Bali, MD;  Location: [facility];  Service: Endoscopy;  Laterality: N/A;  12:15-moved to 10:20am [name] to notify pt   No Known Allergies  Current Outpatient Prescriptions  Medication Sig Dispense Refill  . acetaminophen (TYLENOL) 500 MG tablet Take 500 mg by mouth every 6 (six) hours as needed for pain.      . Flaxseed, Linseed, (FLAXSEED OIL PO) Take 1 capsule by mouth daily.       Marland Kitchen omeprazole (PRILOSEC) 20 MG capsule 1 po 30 mins PRIOR TO YOUR FIRST MEAL FOREVER  31 capsule  11    Review of Systems     Objective:   Physical Exam  Vitals reviewed. Constitutional: He is oriented to person, place, and time. He appears well-nourished. No distress.  HENT:  Head: Normocephalic and atraumatic.  Mouth/Throat: Oropharynx is clear and moist. No oropharyngeal exudate.  Eyes: Pupils are equal, round, and reactive to light. No scleral icterus.  Neck: Normal range of motion. Neck supple.  Cardiovascular: Normal rate, regular rhythm and normal heart sounds.   Pulmonary/Chest: Effort normal and  breath sounds normal. No respiratory distress.  Abdominal: Soft. Bowel sounds are normal. He exhibits no distension. There is no tenderness.  Musculoskeletal: He exhibits no edema.  Lymphadenopathy:    He has no cervical adenopathy.  Neurological: He is alert and oriented to person, place, and time.  NO FOCAL DEFICITS   Psychiatric: He has a normal mood and affect.          Assessment & Plan:

## 2013-05-24 NOTE — Assessment & Plan Note (Signed)
SX CONTROLLED.  LOW FAT DIET PRILOSEC DAILY OPV IN 6-12 MOS

## 2013-05-24 NOTE — Patient Instructions (Signed)
Use Prilosec 30 minutes prior to your first meal.  FOLLOW A LOW FAT DIET.   FOLLOW UP IN 6 MOS TO 1 YEAR.   Lifestyle and home remedies You may eliminate or reduce the frequency of heartburn by making the following lifestyle changes:    Control your weight. Being overweight is a major risk factor for heartburn and GERD. Excess pounds put pressure on your abdomen, pushing up your stomach and causing acid to back up into your esophagus.     Eat smaller meals. 4 TO 6 MEALS A DAY. This reduces pressure on the lower esophageal sphincter, helping to prevent the valve from opening and acid from washing back into your esophagus.     Loosen your belt. Clothes that fit tightly around your waist put pressure on your abdomen and the lower esophageal sphincter.    Eliminate heartburn triggers. Everyone has specific triggers.     Common triggers such as fatty or fried foods, spicy food, tomato sauce, carbonated beverages, alcohol, chocolate, mint, garlic, onion, caffeine and nicotine may make heartburn worse.     Avoid stooping or bending. Tying your shoes is OK. Bending over for longer periods to weed your garden isn't, especially soon after eating.     Don't lie down after a meal. Wait at least three to four hours after eating before going to bed, and don't lie down right after eating.   Alternative medicine   Several home remedies exist for treating GERD, but they provide only temporary relief. They include drinking baking soda (sodium bicarbonate) added to water or drinking other fluids such as baking soda mixed with cream of tartar and water.   Although these liquids create temporary relief by neutralizing, washing away or buffering acids, eventually they aggravate the situation by adding gas and fluid to your stomach, increasing pressure and causing more acid reflux. Further, adding more sodium to your diet may increase your blood pressure and add stress to your heart, and excessive bicarbonate  ingestion can alter the acid-base balance in your body.

## 2013-05-25 NOTE — Progress Notes (Signed)
Reminder in epic °

## 2013-05-31 NOTE — Progress Notes (Signed)
Cc PCP 

## 2013-08-30 ENCOUNTER — Other Ambulatory Visit: Payer: Self-pay

## 2013-09-13 IMAGING — CT CT ABD-PELV W/O CM
2 of 3 series · 16 of 46 positions shown, 18 images · non-contrast
Comparison: None

CLINICAL DATA: Hematuria and pelvic pain

CT ABDOMEN AND PELVIS WITHOUT CONTRAST
TECHNIQUE: Multidetector CT imaging of the abdomen and pelvis was
performed following the standard protocol without intravenous
contrast.

[Series 2: standard/full over (age)lbs 5.0 · axial · 0.65mm/px · z∈[-566,-211]mm · 13 of 83 slices shown, 15 images]
[im 6/83  soft-tissue]
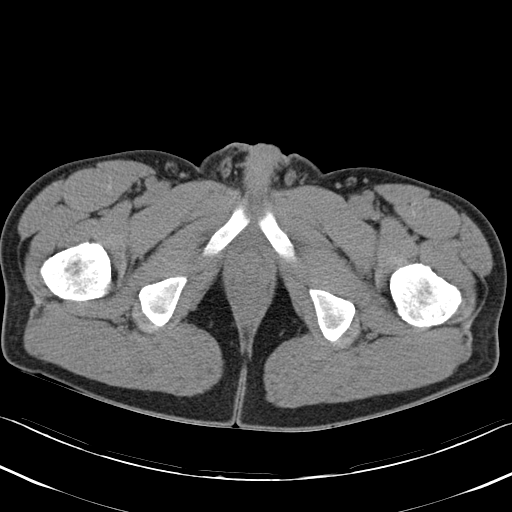
[im 6/83  bone]
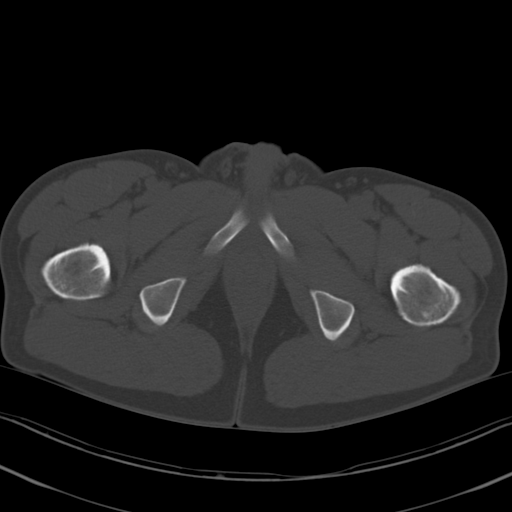
[im 11/83  soft-tissue]
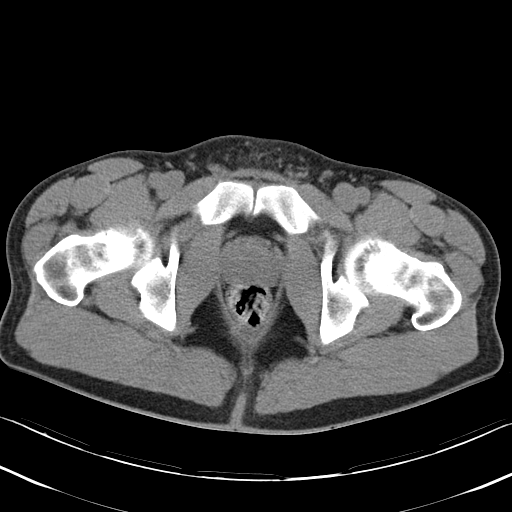
[im 16/83  soft-tissue]
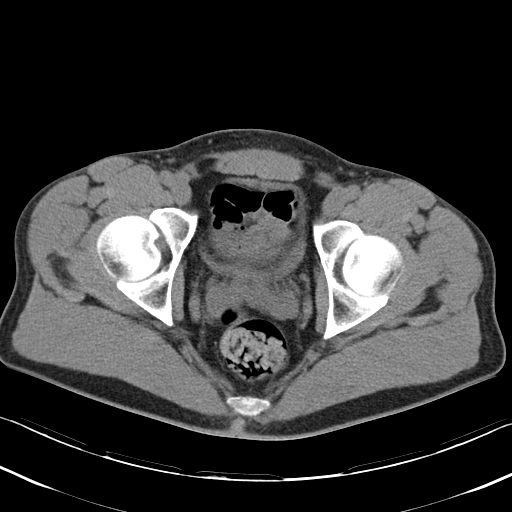
[im 24/83  soft-tissue]
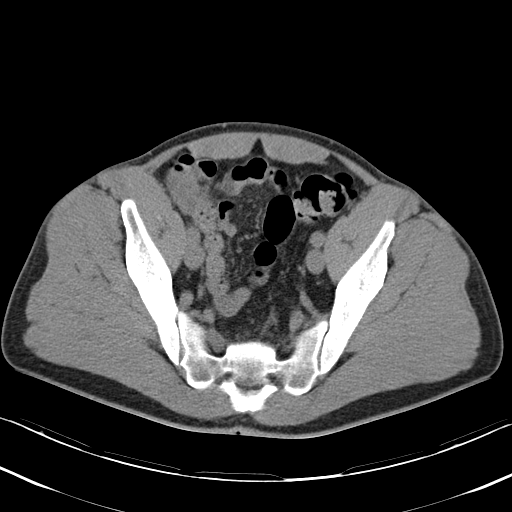
[im 30/83  soft-tissue]
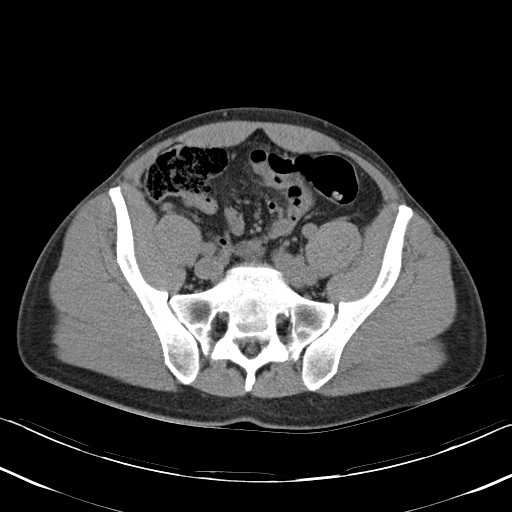
[im 35/83  soft-tissue]
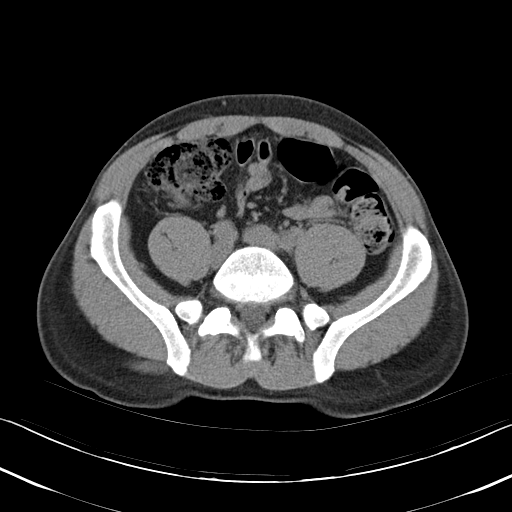
[im 43/83  soft-tissue]
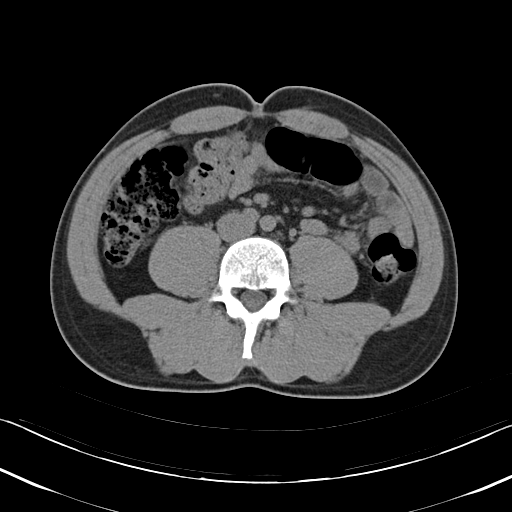
[im 48/83  soft-tissue]
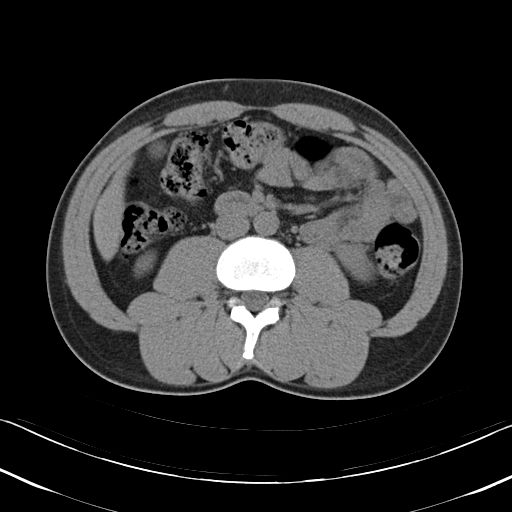
[im 53/83  soft-tissue]
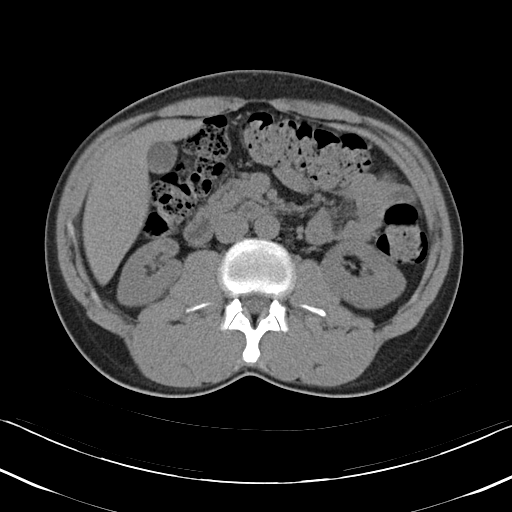
[im 53/83  bone]
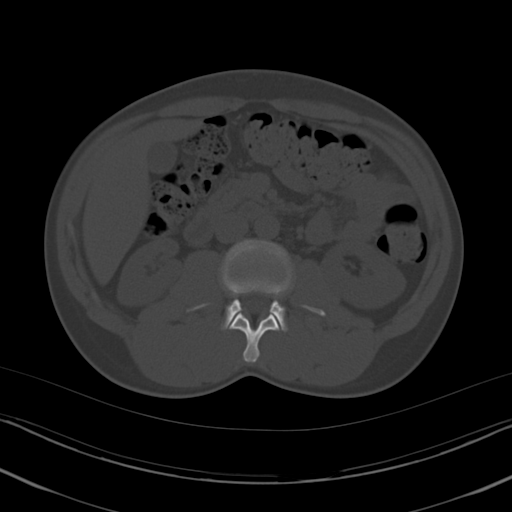
[im 59/83  soft-tissue]
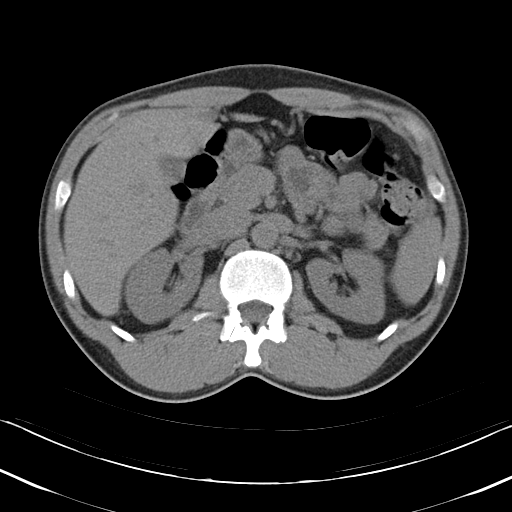
[im 67/83  soft-tissue]
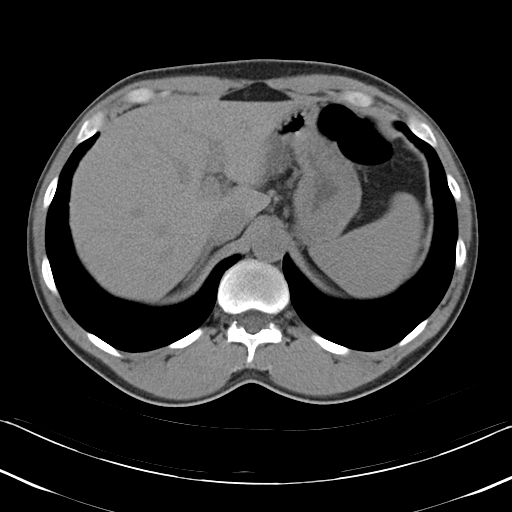
[im 72/83  soft-tissue]
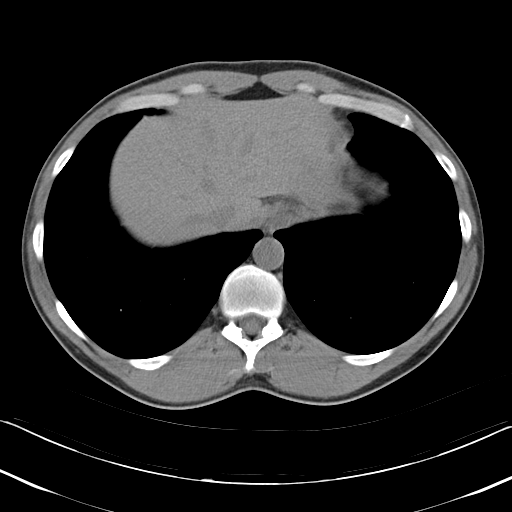
[im 77/83  soft-tissue]
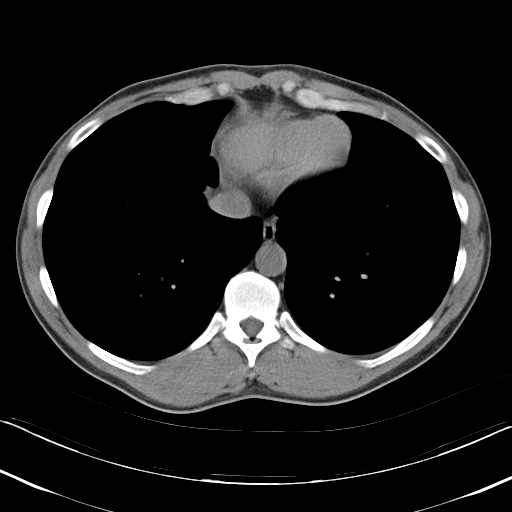

[Series 4: mpr coronal · coronal · 0.61mm/px · 3 of 68 slices shown]
[im 23/68  soft-tissue]
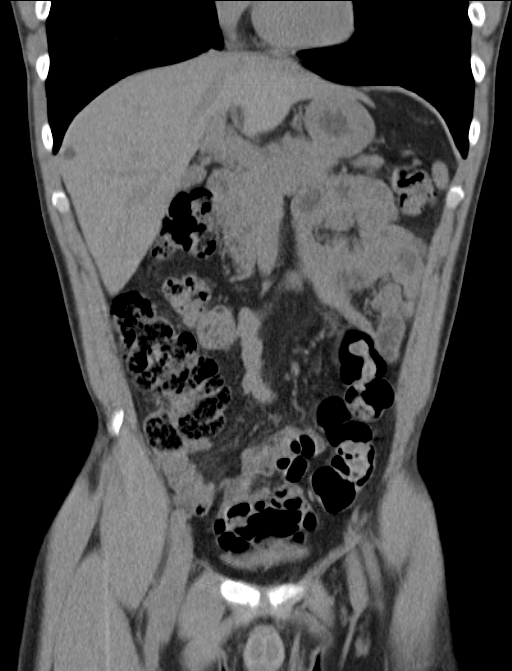
[im 30/68  soft-tissue]
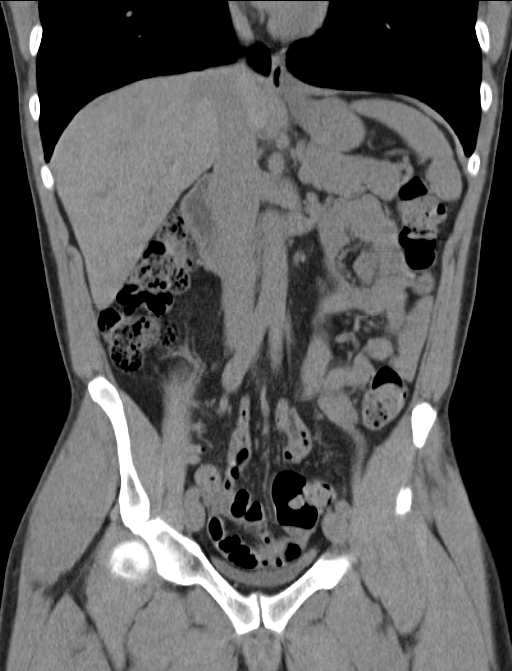
[im 38/68  soft-tissue]
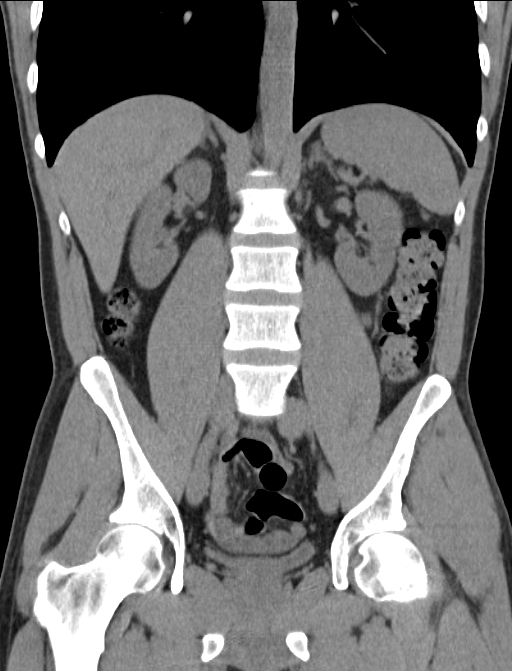

[16 of 46 positions shown; findings below may reference images not displayed]

FINDINGS: Lung bases appear clear.  No pericardial effusion.  7 mm low
attenuation structure in the right hepatic lobe is noted.  Area of
relative hypoattenuation adjacent to falciform ligament is noted,
image 23.  Favored to represent focal fatty deposition.
Gallbladder appears within normal limits.  There is no biliary
dilatation.  The pancreas appears normal.  Normal appearance of the
spleen.

Normal bilateral adrenal glands. The kidneys appear normal.  No
nephrolithiasis or obstructive uropathy.  Urinary bladder appears
within normal limits.

Prostate gland appears normal.

The abdominal aorta has a normal caliber.  No aneurysm.  There is
no adenopathy identified within the upper abdomen or the pelvis.
No inguinal adenopathy.

The stomach appears normal.  The small bowel loops are
unremarkable.  Normal appearance of the colon

Review of the visualized bony structures is unremarkable.
IMPRESSION: 1.  No findings to explain pelvic pain.
2.  No nephrolithiasis or evidence of obstructive uropathy

## 2014-03-11 ENCOUNTER — Other Ambulatory Visit: Payer: Self-pay | Admitting: Gastroenterology

## 2015-05-01 ENCOUNTER — Other Ambulatory Visit (HOSPITAL_COMMUNITY): Payer: Self-pay | Admitting: Oncology

## 2015-05-01 ENCOUNTER — Emergency Department (HOSPITAL_COMMUNITY): Payer: PRIVATE HEALTH INSURANCE

## 2015-05-01 ENCOUNTER — Encounter (HOSPITAL_COMMUNITY): Payer: Self-pay | Admitting: Emergency Medicine

## 2015-05-01 ENCOUNTER — Emergency Department (HOSPITAL_COMMUNITY)
Admission: EM | Admit: 2015-05-01 | Discharge: 2015-05-01 | Disposition: A | Discharge status: Home / Self Care | Payer: PRIVATE HEALTH INSURANCE | Attending: Emergency Medicine | Admitting: Emergency Medicine

## 2015-05-01 DIAGNOSIS — Z8659 Personal history of other mental and behavioral disorders: Secondary | ICD-10-CM | POA: Insufficient documentation

## 2015-05-01 DIAGNOSIS — R05 Cough: Secondary | ICD-10-CM | POA: Diagnosis not present

## 2015-05-01 DIAGNOSIS — R590 Localized enlarged lymph nodes: Secondary | ICD-10-CM | POA: Diagnosis not present

## 2015-05-01 DIAGNOSIS — Z8679 Personal history of other diseases of the circulatory system: Secondary | ICD-10-CM | POA: Diagnosis not present

## 2015-05-01 DIAGNOSIS — G8929 Other chronic pain: Secondary | ICD-10-CM | POA: Diagnosis not present

## 2015-05-01 DIAGNOSIS — K219 Gastro-esophageal reflux disease without esophagitis: Secondary | ICD-10-CM | POA: Diagnosis not present

## 2015-05-01 DIAGNOSIS — R059 Cough, unspecified: Secondary | ICD-10-CM

## 2015-05-01 LAB — CBC WITH DIFFERENTIAL/PLATELET
Basophils Absolute: 0 10*3/uL (ref 0.0–0.1)
Basophils Relative: 0 % (ref 0–1)
EOS ABS: 0.2 10*3/uL (ref 0.0–0.7)
Eosinophils Relative: 4 % (ref 0–5)
HEMATOCRIT: 41.9 % (ref 39.0–52.0)
HEMOGLOBIN: 14 g/dL (ref 13.0–17.0)
LYMPHS ABS: 1.1 10*3/uL (ref 0.7–4.0)
LYMPHS PCT: 23 % (ref 12–46)
MCH: 29.4 pg (ref 26.0–34.0)
MCHC: 33.4 g/dL (ref 30.0–36.0)
MCV: 87.8 fL (ref 78.0–100.0)
MONO ABS: 0.7 10*3/uL (ref 0.1–1.0)
MONOS PCT: 14 % — AB (ref 3–12)
NEUTROS ABS: 2.7 10*3/uL (ref 1.7–7.7)
NEUTROS PCT: 59 % (ref 43–77)
Platelets: 316 10*3/uL (ref 150–400)
RBC: 4.77 MIL/uL (ref 4.22–5.81)
RDW: 11 % — ABNORMAL LOW (ref 11.5–15.5)
WBC: 4.7 10*3/uL (ref 4.0–10.5)

## 2015-05-01 LAB — COMPREHENSIVE METABOLIC PANEL
ALK PHOS: 94 U/L (ref 38–126)
ALT: 51 U/L (ref 17–63)
ANION GAP: 7 (ref 5–15)
AST: 55 U/L — ABNORMAL HIGH (ref 15–41)
Albumin: 3.6 g/dL (ref 3.5–5.0)
BILIRUBIN TOTAL: 0.8 mg/dL (ref 0.3–1.2)
BUN: 19 mg/dL (ref 6–20)
CHLORIDE: 102 mmol/L (ref 101–111)
CO2: 27 mmol/L (ref 22–32)
Calcium: 9 mg/dL (ref 8.9–10.3)
Creatinine, Ser: 1.32 mg/dL — ABNORMAL HIGH (ref 0.61–1.24)
GLUCOSE: 89 mg/dL (ref 65–99)
POTASSIUM: 4 mmol/L (ref 3.5–5.1)
Sodium: 136 mmol/L (ref 135–145)
TOTAL PROTEIN: 7.4 g/dL (ref 6.5–8.1)

## 2015-05-01 MED ORDER — IOHEXOL 300 MG/ML  SOLN
75.0000 mL | Freq: Once | INTRAMUSCULAR | Status: AC | PRN
  Administered 2015-05-01: 75 mL via INTRAVENOUS

## 2015-05-01 NOTE — Discharge Instructions (Signed)
Pulmonary Nodule A pulmonary nodule is a small, round growth of tissue in the lung. Pulmonary nodules can range in size from less than 1/5 inch (4 mm) to a little bigger than an inch (25 mm). Most pulmonary nodules are detected when imaging tests of the lung are being performed for a different problem. Pulmonary nodules are usually not cancerous (benign). However, some pulmonary nodules are cancerous (malignant). Follow-up treatment or testing is based on the size of the pulmonary nodule and your risk of getting lung cancer.  CAUSES Benign pulmonary nodules can be caused by various things. Some of the causes include:   Bacterial, fungal, or viral infections. This is usually an old infection that is no longer active, but it can sometimes be a current, active infection.  A benign mass of tissue.  Inflammation from conditions such as rheumatoid arthritis.   Abnormal blood vessels in the lungs. Malignant pulmonary nodules can result from lung cancer or from cancers that spread to the lung from other places in the body. SIGNS AND SYMPTOMS Pulmonary nodules usually do not cause symptoms. DIAGNOSIS Most often, pulmonary nodules are found incidentally when an X-ray or CT scan is performed to look for some other problem in the lung area. To help determine whether a pulmonary nodule is benign or malignant, your health care provider will take a medical history and order a variety of tests. Tests done may include:   Blood tests.  A skin test called a tuberculin test. This test is used to determine if you have been exposed to the germ that causes tuberculosis.   Chest X-rays. If possible, a new X-ray may be compared with X-rays you have had in the past.   CT scan. This test shows smaller pulmonary nodules more clearly than an X-ray.   Positron emission tomography (PET) scan. In this test, a safe amount of a radioactive substance is injected into the bloodstream. Then, the scan takes a picture of  the pulmonary nodule. The radioactive substance is eliminated from your body in your urine.   Biopsy. A tiny piece of the pulmonary nodule is removed so it can be checked under a microscope. TREATMENT  Pulmonary nodules that are benign normally do not require any treatment because they usually do not cause symptoms or breathing problems. Your health care provider may want to monitor the pulmonary nodule through follow-up CT scans. The frequency of these CT scans will vary based on the size of the nodule and the risk factors for lung cancer. For example, CT scans will need to be done more frequently if the pulmonary nodule is larger and if you have a history of smoking and a family history of cancer. Further testing or biopsies may be done if any follow-up CT scan shows that the size of the pulmonary nodule has increased. HOME CARE INSTRUCTIONS  Only take over-the-counter or prescription medicines as directed by your health care provider.  Keep all follow-up appointments with your health care provider. SEEK MEDICAL CARE IF:  You have trouble breathing when you are active.   You feel sick or unusually tired.   You do not feel like eating.   You lose weight without trying to.   You develop chills or night sweats.  SEEK IMMEDIATE MEDICAL CARE IF:  You cannot catch your breath, or you begin wheezing.   You cannot stop coughing.   You cough up blood.   You become dizzy or feel like you are going to pass out.   You   have sudden chest pain.   You have a fever or persistent symptoms for more than 2-3 days.   You have a fever and your symptoms suddenly get worse. MAKE SURE YOU:  Understand these instructions.  Will watch your condition.  Will get help right away if you are not doing well or get worse. Document Released: 08/08/2009 Document Revised: 06/13/2013 Document Reviewed: 04/02/2013 [employer] Patient Information 2015 [employer]. This information is not intended  to replace advice given to you by your health care provider. Make sure you discuss any questions you have with your health care provider.  

## 2015-05-01 NOTE — ED Provider Notes (Signed)
CSN: [id number]     Arrival date & time 05/01/15  4098 History   First MD Initiated Contact with Patient 05/01/15 [phone]     Chief Complaint  Patient presents with  . Cough     (Consider location/radiation/quality/duration/timing/severity/associated sxs/prior Treatment) Patient is a 54 y.o. male presenting with cough. The history is provided by the patient. No language interpreter was used.  Cough Cough characteristics:  Productive Sputum characteristics:  Nondescript Severity:  Moderate Onset quality:  Gradual Duration:  4 weeks Timing:  Constant Progression:  Worsening Chronicity:  New Smoker: no   Relieved by:  Nothing Worsened by:  Nothing tried Ineffective treatments:  None tried Risk factors: no recent infection   Pt reports he has been coughing for a month.  Pt is coughing up yellow phelgm.  No fever,   Past Medical History  Diagnosis Date  . Migraines   . Chronic back pain   . Depression   . Acid reflux    Past Surgical History  Procedure Laterality Date  . No past surgeries    . Colonoscopy  11/06/2012    YNW:GNFA diverticulosis/Small internal hemorrhoids otherwise normal  . Esophagogastroduodenoscopy (egd) with esophageal dilation N/A 01/16/2013    Procedure: ESOPHAGOGASTRODUODENOSCOPY (EGD) WITH ESOPHAGEAL DILATION;  Surgeon: West Bali, MD;  [facility];  Service: Endoscopy;  Laterality: N/A;  12:15-moved to 10:20am [name] to notify pt   Family History  Problem Relation Age of Onset  . Diabetes Father   . Hypertension Brother   . Colon cancer Other    History  Substance Use Topics  . Smoking status: Never Smoker   . Smokeless tobacco: Not on file  . Alcohol Use: No    Review of Systems  Respiratory: Positive for cough.   All other systems reviewed and are negative.     Allergies  Review of patient's allergies indicates no known allergies.  Home Medications   Prior to Admission medications   Medication Sig Start Date End  Date Taking? Authorizing Provider  acetaminophen (TYLENOL) 325 MG tablet Take 650 mg by mouth every 6 (six) hours as needed for headache.   Yes Historical Provider, MD  OVER THE COUNTER MEDICATION Take 1-2 capsules by mouth daily as needed (GI upset). [place]   Yes Historical Provider, MD  omeprazole (PRILOSEC) 20 MG capsule TAKE 1 CAPSULE BY MOUTH 30 MINUTES PRIOR TO YOUR FIRST MEAL DAILY. Patient not taking: Reported on 05/01/2015    Nira Retort, NP   BP 122/80 mmHg  Pulse 107  Temp(Src) 98.4 F (36.9 C)  Resp 20  Ht 5\' 9"  (1.753 m)  Wt 145 lb (65.772 kg)  BMI 21.40 kg/m2  SpO2 98% Physical Exam  Constitutional: He appears well-developed and well-nourished.  HENT:  Head: Normocephalic and atraumatic.  Nose: Nose normal.  Mouth/Throat: Oropharynx is clear and moist.  Eyes: Pupils are equal, round, and reactive to light.  Neck: Normal range of motion.  Cardiovascular: Normal rate and normal heart sounds.   Pulmonary/Chest: Effort normal.  Abdominal: Soft.  Musculoskeletal: Normal range of motion.  Skin: Skin is warm.    ED Course  Procedures (including critical care time) Labs Review Labs Reviewed - No data to display  Imaging Review Dg Chest 2 View  05/01/2015   CLINICAL DATA:  Nonproductive cough for 1 month  EXAM: CHEST  2 VIEW  COMPARISON:  None.  FINDINGS: Cardiac size is within normal limits. There is subtle mild right hilar fullness. Although this  may be vascular in nature adenopathy cannot be excluded. Further correlation with enhanced CT scan of the chest is recommended. No focal infiltrate or pulmonary edema. Bony thorax is unremarkable.  IMPRESSION: Mild right hilar fullness. Although may be vascular in nature adenopathy cannot be excluded. Further correlation with enhanced CT scan of the chest is recommended. No acute infiltrate or pulmonary edema.   Electronically Signed   By: Natasha Mead M.D.   On: 05/01/2015 09:28   Results for orders placed or performed during  the hospital encounter of 05/01/15  CBC with Differential/Platelet  Result Value Ref Range   WBC 4.7 4.0 - 10.5 K/uL   RBC 4.77 4.22 - 5.81 MIL/uL   Hemoglobin 14.0 13.0 - 17.0 g/dL   HCT 16.1 09.6 - 04.5 %   MCV 87.8 78.0 - 100.0 fL   MCH 29.4 26.0 - 34.0 pg   MCHC 33.4 30.0 - 36.0 g/dL   RDW 40.9 (L) 81.1 - 91.4 %   Platelets 316 150 - 400 K/uL   Neutrophils Relative % 59 43 - 77 %   Neutro Abs 2.7 1.7 - 7.7 K/uL   Lymphocytes Relative 23 12 - 46 %   Lymphs Abs 1.1 0.7 - 4.0 K/uL   Monocytes Relative 14 (H) 3 - 12 %   Monocytes Absolute 0.7 0.1 - 1.0 K/uL   Eosinophils Relative 4 0 - 5 %   Eosinophils Absolute 0.2 0.0 - 0.7 K/uL   Basophils Relative 0 0 - 1 %   Basophils Absolute 0.0 0.0 - 0.1 K/uL  Comprehensive metabolic panel  Result Value Ref Range   Sodium 136 135 - 145 mmol/L   Potassium 4.0 3.5 - 5.1 mmol/L   Chloride 102 101 - 111 mmol/L   CO2 27 22 - 32 mmol/L   Glucose, Bld 89 65 - 99 mg/dL   BUN 19 6 - 20 mg/dL   Creatinine, Ser 7.82 (H) 0.61 - 1.24 mg/dL   Calcium 9.0 8.9 - 95.6 mg/dL   Total Protein 7.4 6.5 - 8.1 g/dL   Albumin 3.6 3.5 - 5.0 g/dL   AST 55 (H) 15 - 41 U/L   ALT 51 17 - 63 U/L   Alkaline Phosphatase 94 38 - 126 U/L   Total Bilirubin 0.8 0.3 - 1.2 mg/dL   GFR calc non Af Amer >60 >60 mL/min   GFR calc Af Amer >60 >60 mL/min   Anion gap 7 5 - 15   Dg Chest 2 View  05/01/2015   CLINICAL DATA:  Nonproductive cough for 1 month  EXAM: CHEST  2 VIEW  COMPARISON:  None.  FINDINGS: Cardiac size is within normal limits. There is subtle mild right hilar fullness. Although this may be vascular in nature adenopathy cannot be excluded. Further correlation with enhanced CT scan of the chest is recommended. No focal infiltrate or pulmonary edema. Bony thorax is unremarkable.  IMPRESSION: Mild right hilar fullness. Although may be vascular in nature adenopathy cannot be excluded. Further correlation with enhanced CT scan of the chest is recommended. No acute  infiltrate or pulmonary edema.   Electronically Signed   By: Natasha Mead M.D.   On: 05/01/2015 09:28   Ct Chest W Contrast  05/01/2015   CLINICAL DATA:  Cough for 1 month, cough productive of yellow sputum, history reflux  EXAM: CT CHEST WITH CONTRAST  TECHNIQUE: Multidetector CT imaging of the chest was performed during intravenous contrast administration. Sagittal and coronal MPR images reconstructed from axial  data set.  CONTRAST:  75mL OMNIPAQUE IOHEXOL 300 MG/ML  SOLN IV  COMPARISON:  Chest radiograph 05/01/2015  FINDINGS: Thoracic vascular structures grossly patent.  Significant mediastinal and RIGHT hilar adenopathy.  Enlarged RIGHT hilar nodes measuring up to 1.9 cm and 2.0 cm in greatest short axis.  Enlarged subcarinal node 2.2 cm short axis.  AP window and precarinal nodes up to 14 mm short axis.  RIGHT peritracheal node 13 mm short axis.  Small cyst lateral RIGHT lobe liver 10 mm diameter image 55.  Remaining visualized portion of upper abdomen unremarkable.  Minimal pericardial effusion.  Questionable RIGHT thyroid nodule 17 mm diameter.  Minimal atelectasis in both lower lobes.  Lungs otherwise clear.  No pulmonary infiltrate, pleural effusion, or pneumothorax.  No definite pulmonary mass or nodule.  Bones unremarkable.  IMPRESSION: Significant mediastinal and RIGHT hilar adenopathy.  Findings favor lymphoma though metastatic disease can have a similar appearance, reactive process considered less likely.  Bibasilar atelectasis.  No definite pulmonary mass/nodule identified.  Questionable RIGHT thyroid nodule 17 mm diameter; consider followup non emergent sonographic evaluation.   Electronically Signed   By: Ulyses SouthwardMark  Boles M.D.   On: 05/01/2015 11:33      EKG Interpretation None      MDM I spoke to Vernell Morgansom Kefalaf with Oncology who scheduled pt to be seen on July 12th at 3pm.   I explained results to patient and advised him of my concern that the findings on his scan could be lymphoma or spread from  another area.   Pt understands need for further evaluation   Final diagnoses:  Cough  Hilar adenopathy        Elson AreasLeslie K Chaney Ingram, PA-C 05/01/15 1556  Bethann BerkshireJoseph Zammit, MD 05/02/15 [phone]

## 2015-05-01 NOTE — ED Notes (Signed)
Pt c/o cough x 1 month. Productive cough yellow sputum.

## 2015-05-06 ENCOUNTER — Encounter (HOSPITAL_COMMUNITY): Payer: Self-pay | Admitting: Hematology & Oncology

## 2015-05-06 ENCOUNTER — Encounter (HOSPITAL_COMMUNITY): Payer: PRIVATE HEALTH INSURANCE | Attending: Hematology & Oncology | Admitting: Hematology & Oncology

## 2015-05-06 VITALS — BP 107/67 | HR 99 | Temp 98.8°F | Resp 18 | Ht 67.5 in | Wt 138.9 lb

## 2015-05-06 DIAGNOSIS — K219 Gastro-esophageal reflux disease without esophagitis: Secondary | ICD-10-CM | POA: Insufficient documentation

## 2015-05-06 DIAGNOSIS — M549 Dorsalgia, unspecified: Secondary | ICD-10-CM | POA: Insufficient documentation

## 2015-05-06 DIAGNOSIS — R5383 Other fatigue: Secondary | ICD-10-CM | POA: Diagnosis not present

## 2015-05-06 DIAGNOSIS — F329 Major depressive disorder, single episode, unspecified: Secondary | ICD-10-CM | POA: Diagnosis not present

## 2015-05-06 DIAGNOSIS — R599 Enlarged lymph nodes, unspecified: Secondary | ICD-10-CM | POA: Diagnosis present

## 2015-05-06 DIAGNOSIS — Z79899 Other long term (current) drug therapy: Secondary | ICD-10-CM | POA: Insufficient documentation

## 2015-05-06 DIAGNOSIS — R05 Cough: Secondary | ICD-10-CM | POA: Diagnosis not present

## 2015-05-06 DIAGNOSIS — E041 Nontoxic single thyroid nodule: Secondary | ICD-10-CM | POA: Diagnosis not present

## 2015-05-06 DIAGNOSIS — R590 Localized enlarged lymph nodes: Secondary | ICD-10-CM

## 2015-05-06 DIAGNOSIS — R634 Abnormal weight loss: Secondary | ICD-10-CM | POA: Insufficient documentation

## 2015-05-06 DIAGNOSIS — G8929 Other chronic pain: Secondary | ICD-10-CM | POA: Insufficient documentation

## 2015-05-06 LAB — LACTATE DEHYDROGENASE: LDH: 212 U/L — AB (ref 98–192)

## 2015-05-06 LAB — SEDIMENTATION RATE: SED RATE: 40 mm/h — AB (ref 0–16)

## 2015-05-06 NOTE — Progress Notes (Signed)
[facility] at [facility]  Patient Care Team: Salley ScarletKawanta F Darby, MD as PCP - General (Family Medicine) West BaliSandi L Fields, MD as Attending Physician (Gastroenterology)  CHIEF COMPLAINTS/PURPOSE OF CONSULTATION:  Cough CT chest on 05/01/2015 with significant mediastinal and R hilar adenopathy, findings favor lymphoma though metastatic disease can have a similar appearance. Questionable R thyroid nodule 17mm diameter.  HISTORY OF PRESENTING ILLNESS:  Rica RecordsMichael R [name] 54 y.o. male is here because of abnormal imaging of the chest from a recent ER visit. He presented the the ED with cough of 4 weeks duration. He tried multiple over-the-counter medications with no relief. He is a nonsmoker. He denies recent infection. He presented to the emergency department because he noted that his cough was progressive and not improving.  He is here alone today. He is visibly nervous about his referral to oncology/hematology.  His breathing has become very difficult. He was coughing for over a month but attributed it to a cold, as a lot of people at work had been sick. He realized he had the cough too long for the typical cough. He does not cough at night, only while talking. He has lost 15 lbs due to not eating the same. He has less energy and is more tired. The decrease in energy is a more recent thing, though he often feels like all he wants to do is lay on the couch. He has been constipated. He denies any problems with urination He has some joint pain. He denies any rash or headaches. He says he "often feels warm cold and warm cold". He mentions a fever at night, though he has never checked it with a thermometer. He says he runs the A/C all night and still feels damp. He has soreness in his esophagus due to the coughing. When he coughs he feels as though something is constricting his airway.  He used to go to the gym but stopped due to the cough and energy loss.  He finds that he  has no trouble while at work. Noting that the place he works is extremely hot. 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  He is not aware of any family history of cancer.  He is here today for additional recommendations given his symptoms and findings on CT of the chest.  MEDICAL HISTORY:  Past Medical History  Diagnosis Date  . Migraines   . Chronic back pain   . Depression   . Acid reflux     SURGICAL HISTORY: Past Surgical History  Procedure Laterality Date  . No past surgeries    . Colonoscopy  11/06/2012    ZOX:WRUESLF:Mild diverticulosis/Small internal hemorrhoids otherwise normal  . Esophagogastroduodenoscopy (egd) with esophageal dilation N/A 01/16/2013    Procedure: ESOPHAGOGASTRODUODENOSCOPY (EGD) WITH ESOPHAGEAL DILATION;  Surgeon: West BaliSandi L Fields, MD;  Location: [facility];  Service: Endoscopy;  Laterality: N/A;  12:15-moved to 10:20am Soledad GerlachLeigh Ann to notify pt    SOCIAL HISTORY: History   Social History  . Marital Status: Married    Spouse Name: N/A  . Number of Children: N/A  . Years of Education: N/A   Occupational History  . Not on file.   Social History Main Topics  . Smoking status: Never Smoker   . Smokeless tobacco: Never Used  . Alcohol Use: No  . Drug Use: No  . Sexual  Activity: Yes    Birth Control/ Protection: None   Other Topics Concern  . Not on file   Social History Narrative  Divorced. 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 Non smoker. ETOH, none.  FAMILY HISTORY: Family History  Problem Relation Age of Onset  . Diabetes Father   . Hypertension Brother   . Colon cancer Other    indicated that his mother is alive. He indicated that his father is alive. He indicated that all of his three brothers are alive. He indicated that his other is  alive.  Mother is 37 yo. She takes medication. Father is 19 yo. More healthy than his mother. 3 brothers. Younger brother has high blood pressure Older brother is diabetic  ALLERGIES:  has No Known Allergies.  MEDICATIONS:  Current Outpatient Prescriptions  Medication Sig Dispense Refill  . acetaminophen (TYLENOL) 325 MG tablet Take 650 mg by mouth every 6 (six) hours as needed for headache.    Marland Kitchen OVER THE COUNTER MEDICATION Take 1-2 capsules by mouth daily as needed (GI upset). [employer]    . omeprazole (PRILOSEC) 20 MG capsule TAKE 1 CAPSULE BY MOUTH 30 MINUTES PRIOR TO YOUR FIRST MEAL DAILY. (Patient not taking: Reported on 05/01/2015) 31 capsule 3   No current facility-administered medications for this visit.    Review of Systems  Constitutional: Positive for weight loss, malaise/fatigue and diaphoresis.       Night sweats.  HENT: Positive for sore throat.        Sore throat due to constant coughing  Eyes:       One instance of vision change.  Respiratory: Positive for cough and shortness of breath.        Dry cough.  Gastrointestinal: Positive for constipation.  Genitourinary: Negative for dysuria.  Musculoskeletal: Positive for joint pain.  Skin: Negative for rash.  Neurological: Negative for headaches.  Psychiatric/Behavioral: The patient is nervous/anxious.    14 point ROS was done and is otherwise as detailed above or in HPI   PHYSICAL EXAMINATION: ECOG PERFORMANCE STATUS: 1 - Symptomatic but completely ambulatory  Filed Vitals:   05/06/15 1500  BP: 107/67  Pulse: 99  Temp: 98.8 F (37.1 C)  Resp: 18   Filed Weights   05/06/15 1500  Weight: 138 lb 14.4 oz (63.005 kg)     Physical Exam  Constitutional: He is oriented to person, place, and time and well-developed, well-nourished, and in no distress.  HENT:  Head: Normocephalic and atraumatic.  Nose: Nose normal.  Mouth/Throat: Oropharynx is clear and moist. No oropharyngeal exudate.  Eyes:  Conjunctivae and EOM are normal. Pupils are equal, round, and reactive to light. Right eye exhibits no discharge. Left eye exhibits no discharge. No scleral icterus.  Neck: Normal range of motion. Neck supple. No tracheal deviation present. No thyromegaly present.  Cardiovascular: Normal rate, regular rhythm and normal heart sounds.  Exam reveals no gallop and no friction rub.   No murmur heard. Pulmonary/Chest: Effort normal and breath sounds normal. He has no wheezes. He has no rales.  Abdominal: Soft. Bowel sounds are normal. He exhibits no distension and no mass. There is no tenderness. There is no rebound and no guarding.  Musculoskeletal: Normal range of motion. He exhibits no edema.  Lymphadenopathy:    He has no cervical adenopathy.    He has axillary adenopathy.  Left axillary: Lateral adenopathy present.  Mobile   Neurological: He is alert and oriented to person, place, and time. He has normal reflexes. No cranial nerve deficit. Gait normal. Coordination normal.  Skin: Skin is warm and dry. No rash noted.  Psychiatric: Mood, memory, affect and judgment normal.  Nursing note and vitals reviewed.   LABORATORY DATA:  I have reviewed the data as listed Lab Results  Component Value Date   WBC 4.7 05/01/2015   HGB 14.0 05/01/2015   HCT 41.9 05/01/2015   MCV 87.8 05/01/2015   PLT 316 05/01/2015   CMP     Component Value Date/Time   NA 138 05/22/2015 1045   K 4.3 05/22/2015 1045   CL 105 05/22/2015 1045   CO2 28 05/22/2015 1045   GLUCOSE 142* 05/22/2015 1045   BUN 10 05/22/2015 1045   CREATININE 1.40* 05/22/2015 1045   CREATININE 1.09 12/21/2012 1645   CALCIUM 8.9 05/22/2015 1045   PROT 6.7 05/22/2015 1045   ALBUMIN 3.2* 05/22/2015 1045   AST 47* 05/22/2015 1045   ALT 43 05/22/2015 1045   ALKPHOS 90 05/22/2015 1045   BILITOT 0.7 05/22/2015 1045   GFRNONAA 56* 05/22/2015 1045   GFRNONAA 79 12/21/2012 1645   GFRAA >60 05/22/2015 1045   GFRAA >89 12/21/2012 1645      RADIOGRAPHIC STUDIES: I have personally reviewed the radiological images as listed and agreed with the findings in the report.  CLINICAL DATA: Cough for 1 month, cough productive of yellow sputum, history reflux  EXAM: CT CHEST WITH CONTRAST  TECHNIQUE: Multidetector CT imaging of the chest was performed during intravenous contrast administration. Sagittal and coronal MPR images reconstructed from axial data set.  CONTRAST: 75mL OMNIPAQUE IOHEXOL 300 MG/ML SOLN IV  COMPARISON: Chest radiograph 05/01/2015  FINDINGS: Thoracic vascular structures grossly patent.  Significant mediastinal and RIGHT hilar adenopathy.  Enlarged RIGHT hilar nodes measuring up to 1.9 cm and 2.0 cm in greatest short axis.  Enlarged subcarinal node 2.2 cm short axis.  AP window and precarinal nodes up to 14 mm short axis.  RIGHT peritracheal node 13 mm short axis.  Small cyst lateral RIGHT lobe liver 10 mm diameter image 55.  Remaining visualized portion of upper abdomen unremarkable.  Minimal pericardial effusion.  Questionable RIGHT thyroid nodule 17 mm diameter.  Minimal atelectasis in both lower lobes.  Lungs otherwise clear.  No pulmonary infiltrate, pleural effusion, or pneumothorax.  No definite pulmonary mass or nodule.  Bones unremarkable.  IMPRESSION: Significant mediastinal and RIGHT hilar adenopathy.  Findings favor lymphoma though metastatic disease can have a similar appearance, reactive process considered less likely.  Bibasilar atelectasis.  No definite pulmonary mass/nodule identified.  Questionable RIGHT thyroid nodule 17 mm diameter; consider followup non emergent sonographic evaluation.   Electronically Signed  By: Ulyses Southward M.D.  On: 05/01/2015 11:33  ASSESSMENT & PLAN:  Cough Weight loss Fatigue Abnormal CT imaging of the chest  Given his weight loss, cough, increasing fatigue and abnormal CT imaging of the  chest, I have recommended PET/CT imaging for additional evaluation. I do feel palpable adenopathy under the left axillary region. I advised the patient that PET imaging not only will help give a clearer picture of potential diagnoses, extent of disease but also will help guide the safest place for biopsy. We reviewed his CT imaging together today.  Once PET imaging is completed we will arrange for definitive biopsy. I discussed the plan with the patient in detail he has a good understanding and  wishes to proceed.  Orders Placed This Encounter  Procedures  . Lactate dehydrogenase  . Sedimentation rate    All questions were answered. The patient knows to call the clinic with any problems, questions or concerns.  This note was electronically signed.    This document serves as a record of services personally performed by Loma Messing, MD. It was created on her behalf by Delana Meyer, a trained medical scribe. The creation of this record is based on the scribe's personal observations and the provider's statements to them. This document has been checked and approved by the attending provider.  I have reviewed the above documentation for accuracy and completeness, and I agree with the above.  Arvil Chaco, MD

## 2015-05-06 NOTE — Progress Notes (Signed)
Rica RecordsMichael R Prasad presented for labwork. Labs per MD order drawn via Peripheral Line 23 gauge needle inserted in right anterior forearm.  Good blood return present. Procedure without incident.  Needle removed intact. Patient tolerated procedure well.

## 2015-05-06 NOTE — Patient Instructions (Signed)
[facility] at [name] Discharge Instructions  RECOMMENDATIONS MADE BY THE CONSULTANT AND ANY TEST RESULTS WILL BE SENT TO YOUR REFERRING PHYSICIAN.  Exam with Dr. Galen ManilaPenland today. [name] as scheduled. Return on 05/12/15 for office visit with Jenita Seashoreom Kefalas, PA-C.   Thank you for choosing [facility] at [name] to provide your oncology and hematology care.  To afford each patient quality time with our provider, please arrive at least 15 minutes before your scheduled appointment time.    You need to re-schedule your appointment should you arrive 10 or more minutes late.  We strive to give you quality time with our providers, and arriving late affects you and other patients whose appointments are after yours.  Also, if you no show three or more times for appointments you may be dismissed from the clinic at the providers discretion.     Again, thank you for choosing [facility].  Our hope is that these requests will decrease the amount of time that you wait before being seen by our physicians.       _____________________________________________________________  Should you have questions after your visit to [facility], please contact our office at [phone] between the hours of 8:30 a.m. and 4:30 p.m.  Voicemails left after 4:30 p.m. will not be returned until the following business day.  For prescription refill requests, have your pharmacy contact our office.

## 2015-05-08 ENCOUNTER — Telehealth (HOSPITAL_COMMUNITY): Payer: Self-pay | Admitting: Hematology & Oncology

## 2015-05-08 NOTE — Telephone Encounter (Signed)
MUST [employer] [facility] FAX FORM TO [phone]. EXTRA FORMS ARE ON MY DESK  April MansonAngela Denny [facility] [phone]

## 2015-05-09 ENCOUNTER — Ambulatory Visit
Admission: RE | Admit: 2015-05-09 | Discharge: 2015-05-09 | Disposition: A | Discharge status: Home / Self Care | Payer: PRIVATE HEALTH INSURANCE | Source: Ambulatory Visit | Attending: Oncology | Admitting: Oncology

## 2015-05-09 DIAGNOSIS — R599 Enlarged lymph nodes, unspecified: Secondary | ICD-10-CM | POA: Insufficient documentation

## 2015-05-09 DIAGNOSIS — R590 Localized enlarged lymph nodes: Secondary | ICD-10-CM

## 2015-05-09 LAB — GLUCOSE, CAPILLARY: Glucose-Capillary: 87 mg/dL (ref 65–99)

## 2015-05-09 MED ORDER — FLUDEOXYGLUCOSE F - 18 (FDG) INJECTION
13.1800 | Freq: Once | INTRAVENOUS | Status: AC | PRN
  Administered 2015-05-09: 13.18 via INTRAVENOUS

## 2015-05-12 ENCOUNTER — Encounter (HOSPITAL_COMMUNITY): Payer: Self-pay | Admitting: Oncology

## 2015-05-12 ENCOUNTER — Encounter (HOSPITAL_BASED_OUTPATIENT_CLINIC_OR_DEPARTMENT_OTHER): Payer: PRIVATE HEALTH INSURANCE | Admitting: Oncology

## 2015-05-12 ENCOUNTER — Encounter (HOSPITAL_COMMUNITY): Payer: PRIVATE HEALTH INSURANCE

## 2015-05-12 VITALS — BP 101/69 | HR 88 | Temp 97.7°F | Resp 18 | Wt 141.2 lb

## 2015-05-12 DIAGNOSIS — R05 Cough: Secondary | ICD-10-CM

## 2015-05-12 DIAGNOSIS — R591 Generalized enlarged lymph nodes: Secondary | ICD-10-CM | POA: Diagnosis not present

## 2015-05-12 DIAGNOSIS — R599 Enlarged lymph nodes, unspecified: Secondary | ICD-10-CM

## 2015-05-12 HISTORY — DX: Enlarged lymph nodes, unspecified: R59.9

## 2015-05-12 NOTE — Assessment & Plan Note (Signed)
Hypermetabolic of PET imaging.  Will refer to CTS for consideration of biopsy.  I have sent a message to C[facility] via [facility].  Return in 2 weeks by which time the patient will have hopefully undergone biopsy so we can proceed with treatment accordingly.  If expedited, we will see him back sooner than 2 weeks.

## 2015-05-12 NOTE — Progress Notes (Signed)
No PCP Per Patient No address on file  Adenopathy  CURRENT THERAPY: Work-up   INTERVAL HISTORY: Brandon [name] 54 y.o. male returns for followup of intrathoracic adenopathy, pathologic, needing biopsy confirmation.   I personally reviewed and went over radiographic studies with the patient.  The results are noted within this dictation.    He admits to continued cough with episodes of SOB.  He reports fevers, with chills as well.  Past Medical History  Diagnosis Date  . Migraines   . Chronic back pain   . Depression   . Acid reflux   . Adenopathy 05/12/2015    has Migraine headache; Abdominal pain, unspecified site; Microscopic hematuria; Renal insufficiency; Hyperlipidemia; Mild acid reflux; Dysphagia, unspecified(787.20); and Adenopathy on his problem list.     has No Known Allergies.  Current Outpatient Prescriptions on File Prior to Visit  Medication Sig Dispense Refill  . acetaminophen (TYLENOL) 325 MG tablet Take 650 mg by mouth every 6 (six) hours as needed for headache.    Marland Kitchen OVER THE COUNTER MEDICATION Take 1-2 capsules by mouth daily as needed (GI upset). [employer]    . omeprazole (PRILOSEC) 20 MG capsule TAKE 1 CAPSULE BY MOUTH 30 MINUTES PRIOR TO YOUR FIRST MEAL DAILY. (Patient not taking: Reported on 05/01/2015) 31 capsule 3   No current facility-administered medications on file prior to visit.    Past Surgical History  Procedure Laterality Date  . No past surgeries    . Colonoscopy  11/06/2012    ZOX:WRUE diverticulosis/Small internal hemorrhoids otherwise normal  . Esophagogastroduodenoscopy (egd) with esophageal dilation N/A 01/16/2013    Procedure: ESOPHAGOGASTRODUODENOSCOPY (EGD) WITH ESOPHAGEAL DILATION;  Surgeon: West Bali, MD;  [facility];  Service: Endoscopy;  Laterality: N/A;  12:15-moved to 10:20am [name] to notify pt    Denies any headaches, dizziness, double vision, nausea, vomiting, diarrhea, constipation, chest  pain, heart palpitations, blood in stool, black tarry stool, urinary pain, urinary burning, urinary frequency, hematuria.   PHYSICAL EXAMINATION  ECOG PERFORMANCE STATUS: 1 - Symptomatic but completely ambulatory  Filed Vitals:   05/12/15 1142  BP: 101/69  Pulse: 88  Temp: 97.7 F (36.5 C)  Resp: 18    GENERAL:alert, no distress, well nourished, well developed, comfortable, cooperative and smiling SKIN: skin color, texture, turgor are normal, no rashes or significant lesions HEAD: Normocephalic, No masses, lesions, tenderness or abnormalities EYES: normal, Conjunctiva are pink and non-injected EARS: External ears normal OROPHARYNX:lips, buccal mucosa, and tongue normal and mucous membranes are moist  NECK: trachea midline LYMPH:  not examined BREAST:not examined LUNGS: not examined HEART: not examined ABDOMEN:abdomen soft and normal bowel sounds BACK: Back symmetric, no curvature. EXTREMITIES:less then 2 second capillary refill, no skin discoloration  NEURO: alert & oriented x 3 with fluent speech, no focal motor/sensory deficits, gait normal   LABORATORY DATA: CBC    Component Value Date/Time   WBC 4.7 05/01/2015 1000   RBC 4.77 05/01/2015 1000   HGB 14.0 05/01/2015 1000   HCT 41.9 05/01/2015 1000   PLT 316 05/01/2015 1000   MCV 87.8 05/01/2015 1000   MCH 29.4 05/01/2015 1000   MCHC 33.4 05/01/2015 1000   RDW 11.0* 05/01/2015 1000   LYMPHSABS 1.1 05/01/2015 1000   MONOABS 0.7 05/01/2015 1000   EOSABS 0.2 05/01/2015 1000   BASOSABS 0.0 05/01/2015 1000      Chemistry      Component Value Date/Time   NA 136 05/01/2015 1000  K 4.0 05/01/2015 1000   CL 102 05/01/2015 1000   CO2 27 05/01/2015 1000   BUN 19 05/01/2015 1000   CREATININE 1.32* 05/01/2015 1000   CREATININE 1.09 12/21/2012 1645      Component Value Date/Time   CALCIUM 9.0 05/01/2015 1000   ALKPHOS 94 05/01/2015 1000   AST 55* 05/01/2015 1000   ALT 51 05/01/2015 1000   BILITOT 0.8 05/01/2015  1000        PENDING LABS:   RADIOGRAPHIC STUDIES:  Dg Chest 2 View  05/01/2015   CLINICAL DATA:  Nonproductive cough for 1 month  EXAM: CHEST  2 VIEW  COMPARISON:  None.  FINDINGS: Cardiac size is within normal limits. There is subtle mild right hilar fullness. Although this may be vascular in nature adenopathy cannot be excluded. Further correlation with enhanced CT scan of the chest is recommended. No focal infiltrate or pulmonary edema. Bony thorax is unremarkable.  IMPRESSION: Mild right hilar fullness. Although may be vascular in nature adenopathy cannot be excluded. Further correlation with enhanced CT scan of the chest is recommended. No acute infiltrate or pulmonary edema.   Electronically Signed   By: Natasha MeadLiviu  Pop M.D.   On: 05/01/2015 09:28   Ct Chest W Contrast  05/01/2015   CLINICAL DATA:  Cough for 1 month, cough productive of yellow sputum, history reflux  EXAM: CT CHEST WITH CONTRAST  TECHNIQUE: Multidetector CT imaging of the chest was performed during intravenous contrast administration. Sagittal and coronal MPR images reconstructed from axial data set.  CONTRAST:  75mL OMNIPAQUE IOHEXOL 300 MG/ML  SOLN IV  COMPARISON:  Chest radiograph 05/01/2015  FINDINGS: Thoracic vascular structures grossly patent.  Significant mediastinal and RIGHT hilar adenopathy.  Enlarged RIGHT hilar nodes measuring up to 1.9 cm and 2.0 cm in greatest short axis.  Enlarged subcarinal node 2.2 cm short axis.  AP window and precarinal nodes up to 14 mm short axis.  RIGHT peritracheal node 13 mm short axis.  Small cyst lateral RIGHT lobe liver 10 mm diameter image 55.  Remaining visualized portion of upper abdomen unremarkable.  Minimal pericardial effusion.  Questionable RIGHT thyroid nodule 17 mm diameter.  Minimal atelectasis in both lower lobes.  Lungs otherwise clear.  No pulmonary infiltrate, pleural effusion, or pneumothorax.  No definite pulmonary mass or nodule.  Bones unremarkable.  IMPRESSION: Significant  mediastinal and RIGHT hilar adenopathy.  Findings favor lymphoma though metastatic disease can have a similar appearance, reactive process considered less likely.  Bibasilar atelectasis.  No definite pulmonary mass/nodule identified.  Questionable RIGHT thyroid nodule 17 mm diameter; consider followup non emergent sonographic evaluation.   Electronically Signed   By: Ulyses SouthwardMark  Boles M.D.   On: 05/01/2015 11:33   Nm Pet Image Initial (pi) Skull Base To Thigh  05/09/2015   CLINICAL DATA:  Initial treatment strategy for hilar lymphadenopathy. Cough for the past month.  EXAM: NUCLEAR MEDICINE PET SKULL BASE TO THIGH  TECHNIQUE: 13.18 mCi F-18 FDG was injected intravenously. Full-ring PET imaging was performed from the skull base to thigh after the radiotracer. CT data was obtained and used for attenuation correction and anatomic localization.  FASTING BLOOD GLUCOSE:  Value: 87 mg/dl  COMPARISON:  Chest CT 05/01/2015.  FINDINGS: NECK  No hypermetabolic lymph nodes in the neck. Diffuse activity in the thyroid gland bilaterally (SUVmax = 6.0-7.6).  CHEST  There are numerous enlarged and hypermetabolic right hilar and mediastinal lymph nodes. Space ossific examples are as follows: 2.2 cm short axis subcarinal node (  SUVmax = 12.7), right hilar nodal mass which is difficult to separate from adjacent blood vessels on today's noncontrast CT examination, but estimated to measure approximately 2.6 x 4.0 cm (image 96 of series 3), and is diffusely hypermetabolic (SUVmax = 12.3). 2.2 cm short axis low left paratracheal lymph node (SUVmax = 10.7). Several other smaller but hypermetabolic mediastinal and right hilar lymph nodes are also noted. No suspicious pulmonary nodules on the CT scan. No acute consolidative airspace disease. Linear subsegmental atelectasis or scarring in the right lower lobe, and similar findings in the dependent portions of the lower lobes of the lungs bilaterally. No pleural effusions. Small amount of  pericardial fluid and/or thickening, unlikely to be of any hemodynamic significance at this time.  ABDOMEN/PELVIS  No abnormal hypermetabolic activity within the liver, pancreas, adrenal glands, or spleen. No hypermetabolic lymph nodes in the abdomen or pelvis. 9 mm low-attenuation lesion in the periphery of segment 8 of the liver (image 129 of series 3) demonstrates no hypermetabolism, and is incompletely characterized, but similar to prior studies, favored to represent a small cyst. No pathologic distention of small bowel or colon. No hypermetabolic lymphadenopathy arch that no significant volume of ascites. No pneumoperitoneum.  SKELETON  No focal hypermetabolic activity to suggest skeletal metastasis.  IMPRESSION: 1. Extensive hypermetabolic lymphadenopathy throughout the mediastinum and right hilar regions. While this may simply represent a lymphoma, the possibility of small cell lung cancer should be considered, and correlation with biopsy is recommended to establish a tissue diagnosis. 2. No signs of extra thoracic metastatic disease. 3. Relatively diffuse hypermetabolic activity in the thyroid gland bilaterally may suggest thyroiditis. 4. Small amount of pericardial fluid and/or thickening, unlikely to be of any hemodynamic significance at this time. 5. Additional incidental findings, as above.   Electronically Signed   By: Trudie Reed M.D.   On: 05/09/2015 13:38     PATHOLOGY:    ASSESSMENT AND PLAN:  Adenopathy Hypermetabolic of PET imaging.  Will refer to CTS for consideration of biopsy.  I have sent a message to [facility] via [facility].  Return in 2 weeks by which time the patient will have hopefully undergone biopsy so we can proceed with treatment accordingly.  If expedited, we will see him back sooner than 2 weeks.    THERAPY PLAN:  He needs a biopsy to help guide treatment.  He is a very fit and intelligent man.  All questions were answered. The patient knows to call the clinic with any  problems, questions or concerns. We can certainly see the patient much sooner if necessary.  Patient and plan discussed with Dr. Loma Messing and she is in agreement with the aforementioned.   This note is electronically signed by: Tina Griffiths 05/12/2015 12:50 PM

## 2015-05-12 NOTE — Patient Instructions (Signed)
[facility] at [facility] Discharge Instructions  RECOMMENDATIONS MADE BY THE CONSULTANT AND ANY TEST RESULTS WILL BE SENT TO YOUR REFERRING PHYSICIAN.  Exam and discussion by Dellis Aneshomas Kefalas, PA-C We need to get a biopsy to determine what's going on. They will contact you with the date and time of the procedure.  We will see you back after the procedure.  Thank you for choosing [facility] at [facility] to provide your oncology and hematology care.  To afford each patient quality time with our provider, please arrive at least 15 minutes before your scheduled appointment time.    You need to re-schedule your appointment should you arrive 10 or more minutes late.  We strive to give you quality time with our providers, and arriving late affects you and other patients whose appointments are after yours.  Also, if you no show three or more times for appointments you may be dismissed from the clinic at the providers discretion.     Again, thank you for choosing [facility].  Our hope is that these requests will decrease the amount of time that you wait before being seen by our physicians.       _____________________________________________________________  Should you have questions after your visit to [facility], please contact our office at [phone] between the hours of 8:30 a.m. and 4:30 p.m.  Voicemails left after 4:30 p.m. will not be returned until the following business day.  For prescription refill requests, have your pharmacy contact our office.

## 2015-05-20 ENCOUNTER — Encounter: Payer: PRIVATE HEALTH INSURANCE | Admitting: Cardiothoracic Surgery

## 2015-05-21 ENCOUNTER — Encounter: Payer: Self-pay | Admitting: Cardiothoracic Surgery

## 2015-05-21 ENCOUNTER — Other Ambulatory Visit: Payer: Self-pay | Admitting: *Deleted

## 2015-05-21 ENCOUNTER — Encounter (HOSPITAL_COMMUNITY): Payer: Self-pay | Admitting: *Deleted

## 2015-05-21 ENCOUNTER — Institutional Professional Consult (permissible substitution) (INDEPENDENT_AMBULATORY_CARE_PROVIDER_SITE_OTHER): Payer: PRIVATE HEALTH INSURANCE | Admitting: Cardiothoracic Surgery

## 2015-05-21 VITALS — BP 112/79 | HR 97 | Resp 16 | Ht 69.0 in | Wt 135.0 lb

## 2015-05-21 DIAGNOSIS — R599 Enlarged lymph nodes, unspecified: Secondary | ICD-10-CM

## 2015-05-21 DIAGNOSIS — R590 Localized enlarged lymph nodes: Secondary | ICD-10-CM

## 2015-05-21 MED ORDER — DEXTROSE 5 % IV SOLN
1.5000 g | INTRAVENOUS | Status: AC
  Administered 2015-05-22: 1.5 g via INTRAVENOUS
  Filled 2015-05-21: qty 1.5

## 2015-05-21 MED ORDER — MUPIROCIN 2 % EX OINT
1.0000 "application " | TOPICAL_OINTMENT | CUTANEOUS | Status: AC
  Administered 2015-05-22: 1 via TOPICAL
  Filled 2015-05-21: qty 22

## 2015-05-21 NOTE — Progress Notes (Signed)
Patient ID: Brandon [name], male   DOB: 1961/08/17, 54 y.o.   MRN: [id number]      [address]       [place] [address]             [id number]        GANON DEMASI [facility] Medical Record #[id number] Date of Birth: 1961-06-14  Referring: Claudia Desanctis Primary Care: No PCP Per Patient  Chief Complaint:    Chief Complaint  Patient presents with  . Adenopathy    CT and PET   patient examined, chest CT scan and PET scan personally reviewed  History of Present Illness:     54 year old AA male nonsmoker without history of previous lung disease presents for evaluation of significant mediastinal adenopathy with low level hypermetabolic activity on PET scan. The patient developed low-grade fever with night sweats cough and some shortness of breath. CT scan rule out pulmonary embolus but showed mediastinal adenopathy. The patient was referred to the cancer center at [facility]. He had no palpable lymph nodes. He had mild less than 5 pound weight loss. PET scan showed significant paratracheal right hilar and subcarinal adenopathy. There is no hypermetabolic neck adenopathy or abdominal adenopathy on PET scan.there is no discrete pulmonary mass.the patient was referred to this office by the cancer center regional for biopsy the mediastinal nodes. Concern of lymphoma.  Patient denies family history of sarcoidosis lung cancer or lymphoma.   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 Patient lives independently.   Zubrod Score: At the time of surgery this patient's most appropriate activity status/level should be described as: []     0    Normal activity, no symptoms [x]     1    Restricted in physical strenuous activity but ambulatory, able to do out light work []     2    Ambulatory and capable of self care, unable to do work activities, up and about                 more than 50%  Of the time                             []     3    Only limited self care, in bed greater than 50% of waking hours []     4    Completely disabled, no self care, confined to bed or chair []     5    Moribund  Past Medical History  Diagnosis Date  . Migraines   . Chronic back pain   . Depression   . Acid reflux   . Adenopathy 05/12/2015    Past Surgical History  Procedure Laterality Date  . No past surgeries    . Colonoscopy  11/06/2012    ION:GEXB diverticulosis/Small internal hemorrhoids otherwise normal  . Esophagogastroduodenoscopy (egd) with esophageal dilation N/A 01/16/2013    Procedure: ESOPHAGOGASTRODUODENOSCOPY (EGD) WITH ESOPHAGEAL DILATION;  Surgeon: West Bali, MD;  [facility];  Service: Endoscopy;  Laterality: N/A;  12:15-moved to 10:20am [name] to notify pt    History  Smoking status  . Never Smoker   Smokeless tobacco  . Never Used   History  Alcohol Use No    History   Social History  . Marital Status: Married    Spouse Name: N/A  . Number of Children:  N/A  . Years of Education: N/A   Occupational History  . Not on file.   Social History Main Topics  . Smoking status: Never Smoker   . Smokeless tobacco: Never Used  . Alcohol Use: No  . Drug Use: No  . Sexual Activity: Yes    Birth Control/ Protection: None   Other Topics Concern  . Not on file   Social History Narrative    No Known Allergies  Current Outpatient Prescriptions  Medication Sig Dispense Refill  . acetaminophen (TYLENOL) 325 MG tablet Take 650 mg by mouth every 6 (six) hours as needed for headache.    Marland Kitchen OVER THE COUNTER MEDICATION Take 1-2 capsules by mouth daily as needed (GI upset). [employer]    . omeprazole (PRILOSEC) 20 MG capsule TAKE 1 CAPSULE BY MOUTH 30 MINUTES PRIOR TO YOUR FIRST MEAL DAILY. (Patient not taking: Reported on 05/01/2015) 31 capsule 3   No current facility-administered medications for this visit.     (Not in a hospital admission)  Family  History  Problem Relation Age of Onset  . Diabetes Father   . Hypertension Brother   . Colon cancer Other      Review of Systems:  Patient's upper endoscopy showed evidence of reflux but no lesions. The patient states his colonoscopy was clean.  No history of previous thoracic trauma No history of previous surgical procedures or general anesthesia    Cardiac Review of Systems: Y or N  Chest Pain [   no ]  Resting SOB [no   ] Exertional SOB  [no  ]  Orthopnea [no  ]   Pedal Edema [ no  ]    Palpitations [no  ] Syncope  [ no ]   Presyncope [  no ]  General Review of Systems: [Y] = yes [  ]=no Constitional: recent weight change [weight loss 5 pounds  ]; anorexia [ no ]; fatigue [  ]; nausea [  ]; night sweats Mahler.Beck  ]; fever [ yes ]; or chills [  ]                                                               Dental: poor dentition[  ]; Last Dentist visit:earlier this year patient had a left maxillary root canal and took antibiotics   Eye : blurred vision [  ]; diplopia [   ]; vision changes [no  ];  Amaurosis fugax[  ]; Resp: cough [ yes ];  wheezing[  ];  hemoptysis[  ]; shortness of breath[ yes ]; paroxysmal nocturnal dyspnea[  ]; dyspnea on exertion[  ]; or orthopnea[  ]; some throat irritation requiring clearing his throat freely GI:  gallstones[  ], vomiting[  ];  dysphagia[  ]; melena[  ];  hematochezia [  ]; heartburn[  ];   Hx of  Colonoscopy[  ]; GU: kidney stones [  ]; hematuria[  ];   dysuria [  ];  nocturia[  ];  history of     obstruction [  ]; urinary frequency [  ]             Skin: rash, swelling[  ];, hair loss[  ];  peripheral edema[  ];  or itching[  ]; Musculosketetal: myalgias[  ];  joint swelling[  ];  joint erythema[  ];  joint pain[  ];  back pain[  ];  Heme/Lymph: bruising[no  ];  bleeding[ no ];  anemia[  ];  Neuro: TIA[  ];  headaches[  ];  stroke[  ];  vertigo[  ];  seizures[  ];   paresthesias[  ];  difficulty walking[  ];  Psych:depression[  ]; anxiety[   ];  Endocrine: diabetes[no  ];  thyroid dysfunction[  ];  Immunizations: Flu [  ]; Pneumococcal[  ];  Other:sedimentation rate moderately elevated at 40  Physical Exam: BP 112/79 mmHg  Pulse 97  Resp 16  Ht  (1.753 m)  Wt 135 lb (61.236 kg)  BMI 19.93 kg/m2  SpO2 98%      Physical Exam  General: middle-aged well-developed AA male no distress HEENT: Normocephalic pupils equal , dentition adequate Neck: Supple without JVD, adenopathy, or bruit Chest: Clear to auscultation, symmetrical breath sounds, no rhonchi, no tenderness             or deformity Cardiovascular: Regular rate and rhythm, no murmur, no gallop, peripheral pulses             palpable in all extremities Abdomen:  Soft, nontender, no palpable mass or organomegaly Extremities: Warm, well-perfused, no clubbing cyanosis edema or tenderness,              no venous stasis changes of the legs Rectal/GU: Deferred Neuro: Grossly non--focal and symmetrical throughout Skin: Clean and dry without rash or ulceration    Diagnostic Studies & Laboratory data:     Recent Radiology Findings:  Results of his chest CT scan and PET scan personally reviewed with patient using a anatomic model 2 delineate the location of the lymph nodes which needed biopsy.   I have independently reviewed the above radiologic studies.  Recent Lab Findings: Lab Results  Component Value Date   WBC 4.7 05/01/2015   HGB 14.0 05/01/2015   HCT 41.9 05/01/2015   PLT 316 05/01/2015   GLUCOSE 89 05/01/2015   CHOL 168 12/21/2012   TRIG 71 12/21/2012   HDL 69 12/21/2012   LDLCALC 85 12/21/2012   ALT 51 05/01/2015   AST 55* 05/01/2015   NA 136 05/01/2015   K 4.0 05/01/2015   CL 102 05/01/2015   CREATININE 1.32* 05/01/2015   BUN 19 05/01/2015   CO2 27 05/01/2015   TSH 1.116 06/19/2012      Assessment / Plan:     Significant mediastinal adenopathy  of the right paratracheal region, right hilum ,and  the subcarinal region. Significant  risk for lymphoma so open biopsy to obtain enough tissue for lymphoma markers will be obtained with mediastinoscopy. We'll perform video bronchoscopy first to visualize the airways.   I discussed the procedure video bronchoscopy and mediastinoscopy with patient including indications benefits and risks of bleeding requiring sternotomy. We'll perform the procedure at [facility] hospital tomorrow under general anesthesia.  .    @ 05/21/2015 10:12 AM

## 2015-05-22 ENCOUNTER — Ambulatory Visit (HOSPITAL_COMMUNITY): Payer: PRIVATE HEALTH INSURANCE

## 2015-05-22 ENCOUNTER — Ambulatory Visit (HOSPITAL_COMMUNITY): Payer: PRIVATE HEALTH INSURANCE | Admitting: Certified Registered Nurse Anesthetist

## 2015-05-22 ENCOUNTER — Encounter (HOSPITAL_COMMUNITY): Payer: Self-pay | Admitting: *Deleted

## 2015-05-22 ENCOUNTER — Encounter (HOSPITAL_COMMUNITY)
Admission: RE | Disposition: A | Discharge status: Home / Self Care | Payer: Self-pay | Source: Ambulatory Visit | Attending: Cardiothoracic Surgery

## 2015-05-22 ENCOUNTER — Ambulatory Visit (HOSPITAL_COMMUNITY)
Admission: RE | Admit: 2015-05-22 | Discharge: 2015-05-22 | Disposition: A | Discharge status: Home / Self Care | Payer: PRIVATE HEALTH INSURANCE | Source: Ambulatory Visit | Attending: Cardiothoracic Surgery | Admitting: Cardiothoracic Surgery

## 2015-05-22 DIAGNOSIS — R0602 Shortness of breath: Secondary | ICD-10-CM

## 2015-05-22 DIAGNOSIS — F329 Major depressive disorder, single episode, unspecified: Secondary | ICD-10-CM | POA: Diagnosis not present

## 2015-05-22 DIAGNOSIS — R590 Localized enlarged lymph nodes: Secondary | ICD-10-CM

## 2015-05-22 DIAGNOSIS — K219 Gastro-esophageal reflux disease without esophagitis: Secondary | ICD-10-CM | POA: Insufficient documentation

## 2015-05-22 DIAGNOSIS — I888 Other nonspecific lymphadenitis: Secondary | ICD-10-CM | POA: Insufficient documentation

## 2015-05-22 DIAGNOSIS — Z79899 Other long term (current) drug therapy: Secondary | ICD-10-CM | POA: Insufficient documentation

## 2015-05-22 HISTORY — DX: Constipation, unspecified: K59.00

## 2015-05-22 HISTORY — PX: VIDEO BRONCHSCOPY/MEDIASTINSCOPY: SHX6179

## 2015-05-22 LAB — COMPREHENSIVE METABOLIC PANEL
ALT: 43 U/L (ref 17–63)
AST: 47 U/L — ABNORMAL HIGH (ref 15–41)
Albumin: 3.2 g/dL — ABNORMAL LOW (ref 3.5–5.0)
Alkaline Phosphatase: 90 U/L (ref 38–126)
Anion gap: 5 (ref 5–15)
BUN: 10 mg/dL (ref 6–20)
CO2: 28 mmol/L (ref 22–32)
Calcium: 8.9 mg/dL (ref 8.9–10.3)
Chloride: 105 mmol/L (ref 101–111)
Creatinine, Ser: 1.4 mg/dL — ABNORMAL HIGH (ref 0.61–1.24)
GFR calc Af Amer: >60 mL/min (ref >60)
GFR calc non Af Amer: 56 mL/min — ABNORMAL LOW (ref >60)
Glucose, Bld: 142 mg/dL — ABNORMAL HIGH (ref 65–99)
Potassium: 4.3 mmol/L (ref 3.5–5.1)
Sodium: 138 mmol/L (ref 135–145)
Total Bilirubin: 0.7 mg/dL (ref 0.3–1.2)
Total Protein: 6.7 g/dL (ref 6.5–8.1)

## 2015-05-22 LAB — CBC
HCT: 42.8 % (ref 39.0–52.0)
Hemoglobin: 14.3 g/dL (ref 13.0–17.0)
MCH: 29.7 pg (ref 26.0–34.0)
MCHC: 33.4 g/dL (ref 30.0–36.0)
MCV: 88.8 fL (ref 78.0–100.0)
Platelets: 274 10*3/uL (ref 150–400)
RBC: 4.82 MIL/uL (ref 4.22–5.81)
RDW: 12.2 % (ref 11.5–15.5)
WBC: 6.6 10*3/uL (ref 4.0–10.5)

## 2015-05-22 LAB — SURGICAL PCR SCREEN
MRSA, PCR: NEGATIVE
Staphylococcus aureus: NEGATIVE

## 2015-05-22 LAB — TYPE AND SCREEN
ABO/RH(D): A POS
Antibody Screen: NEGATIVE

## 2015-05-22 LAB — APTT: aPTT: 34 seconds (ref 24–37)

## 2015-05-22 LAB — PROTIME-INR
INR: 1.13 (ref 0.00–1.49)
Prothrombin Time: 14.7 seconds (ref 11.6–15.2)

## 2015-05-22 LAB — ABO/RH: ABO/RH(D): A POS

## 2015-05-22 SURGERY — BRONCHOSCOPY, WITH MEDIASTINOSCOPY
Anesthesia: General

## 2015-05-22 MED ORDER — SODIUM CHLORIDE 0.9 % IJ SOLN
INTRAMUSCULAR | Status: AC
  Filled 2015-05-22: qty 10

## 2015-05-22 MED ORDER — SODIUM CHLORIDE 0.9 % IJ SOLN: 3.0000 mL | Freq: Two times a day (BID) | INTRAMUSCULAR | Status: DC

## 2015-05-22 MED ORDER — 0.9 % SODIUM CHLORIDE (POUR BTL) OPTIME
TOPICAL | Status: DC | PRN
  Administered 2015-05-22: 1000 mL

## 2015-05-22 MED ORDER — NEOSTIGMINE METHYLSULFATE 10 MG/10ML IV SOLN
INTRAVENOUS | Status: AC
  Filled 2015-05-22: qty 1

## 2015-05-22 MED ORDER — OXYCODONE HCL 5 MG/5ML PO SOLN: 5.0000 mg | Freq: Once | ORAL | Status: DC | PRN

## 2015-05-22 MED ORDER — SUCCINYLCHOLINE CHLORIDE 20 MG/ML IJ SOLN
INTRAMUSCULAR | Status: AC
  Filled 2015-05-22: qty 1

## 2015-05-22 MED ORDER — DEXAMETHASONE SODIUM PHOSPHATE 4 MG/ML IJ SOLN
INTRAMUSCULAR | Status: AC
  Filled 2015-05-22: qty 1

## 2015-05-22 MED ORDER — CHLORHEXIDINE GLUCONATE CLOTH 2 % EX PADS: 6.0000 | MEDICATED_PAD | Freq: Once | CUTANEOUS | Status: DC

## 2015-05-22 MED ORDER — PROPOFOL 10 MG/ML IV BOLUS
INTRAVENOUS | Status: DC | PRN
  Administered 2015-05-22: 150 mg via INTRAVENOUS

## 2015-05-22 MED ORDER — FENTANYL CITRATE (PF) 100 MCG/2ML IJ SOLN
INTRAMUSCULAR | Status: AC
  Filled 2015-05-22: qty 2

## 2015-05-22 MED ORDER — MIDAZOLAM HCL 5 MG/5ML IJ SOLN
INTRAMUSCULAR | Status: DC | PRN
  Administered 2015-05-22: 2 mg via INTRAVENOUS

## 2015-05-22 MED ORDER — ROCURONIUM BROMIDE 50 MG/5ML IV SOLN
INTRAVENOUS | Status: AC
  Filled 2015-05-22: qty 1

## 2015-05-22 MED ORDER — GLYCOPYRROLATE 0.2 MG/ML IJ SOLN
INTRAMUSCULAR | Status: DC | PRN
  Administered 2015-05-22: .6 mg via INTRAVENOUS

## 2015-05-22 MED ORDER — PHENYLEPHRINE HCL 10 MG/ML IJ SOLN
10.0000 mg | INTRAMUSCULAR | Status: DC | PRN
  Administered 2015-05-22: 10 ug/min via INTRAVENOUS

## 2015-05-22 MED ORDER — PHENYLEPHRINE HCL 10 MG/ML IJ SOLN
INTRAMUSCULAR | Status: DC | PRN
  Administered 2015-05-22 (×3): 40 ug via INTRAVENOUS
  Administered 2015-05-22 (×2): 80 ug via INTRAVENOUS
  Administered 2015-05-22: 40 ug via INTRAVENOUS

## 2015-05-22 MED ORDER — ACETAMINOPHEN 160 MG/5ML PO SOLN
325.0000 mg | ORAL | Status: DC | PRN
  Filled 2015-05-22: qty 20.3

## 2015-05-22 MED ORDER — OXYCODONE HCL 5 MG PO TABS: 5.0000 mg | ORAL_TABLET | ORAL | Status: DC | PRN

## 2015-05-22 MED ORDER — LIDOCAINE HCL (CARDIAC) 20 MG/ML IV SOLN
INTRAVENOUS | Status: AC
  Filled 2015-05-22: qty 10

## 2015-05-22 MED ORDER — FENTANYL CITRATE (PF) 100 MCG/2ML IJ SOLN
INTRAMUSCULAR | Status: DC | PRN
  Administered 2015-05-22: 100 ug via INTRAVENOUS
  Administered 2015-05-22: 25 ug via INTRAVENOUS
  Administered 2015-05-22: 50 ug via INTRAVENOUS
  Administered 2015-05-22: 100 ug via INTRAVENOUS
  Administered 2015-05-22: 50 ug via INTRAVENOUS
  Administered 2015-05-22: 25 ug via INTRAVENOUS

## 2015-05-22 MED ORDER — EPHEDRINE SULFATE 50 MG/ML IJ SOLN
INTRAMUSCULAR | Status: DC | PRN
  Administered 2015-05-22: 10 mg via INTRAVENOUS

## 2015-05-22 MED ORDER — ACETAMINOPHEN 325 MG PO TABS: 650.0000 mg | ORAL_TABLET | ORAL | Status: DC | PRN

## 2015-05-22 MED ORDER — LACTATED RINGERS IV SOLN
INTRAVENOUS | Status: DC | PRN
  Administered 2015-05-22 (×2): via INTRAVENOUS

## 2015-05-22 MED ORDER — FENTANYL CITRATE (PF) 250 MCG/5ML IJ SOLN
INTRAMUSCULAR | Status: AC
  Filled 2015-05-22: qty 5

## 2015-05-22 MED ORDER — KETOROLAC TROMETHAMINE 15 MG/ML IJ SOLN
INTRAMUSCULAR | Status: AC
  Filled 2015-05-22: qty 1

## 2015-05-22 MED ORDER — FENTANYL CITRATE (PF) 100 MCG/2ML IJ SOLN
25.0000 ug | INTRAMUSCULAR | Status: DC | PRN
  Administered 2015-05-22: 25 ug via INTRAVENOUS

## 2015-05-22 MED ORDER — ACETAMINOPHEN 325 MG PO TABS
325.0000 mg | ORAL_TABLET | ORAL | Status: DC | PRN
Start: 2015-05-22 — End: 2015-05-22

## 2015-05-22 MED ORDER — ROCURONIUM BROMIDE 100 MG/10ML IV SOLN
INTRAVENOUS | Status: DC | PRN
  Administered 2015-05-22: 10 mg via INTRAVENOUS
  Administered 2015-05-22: 5 mg via INTRAVENOUS
  Administered 2015-05-22: 10 mg via INTRAVENOUS
  Administered 2015-05-22: 30 mg via INTRAVENOUS

## 2015-05-22 MED ORDER — ONDANSETRON HCL 4 MG/2ML IJ SOLN
INTRAMUSCULAR | Status: DC | PRN
  Administered 2015-05-22 (×2): 4 mg via INTRAVENOUS

## 2015-05-22 MED ORDER — SODIUM CHLORIDE 0.9 % IJ SOLN: 3.0000 mL | INTRAMUSCULAR | Status: DC | PRN

## 2015-05-22 MED ORDER — PROPOFOL 10 MG/ML IV BOLUS
INTRAVENOUS | Status: AC
  Filled 2015-05-22: qty 20

## 2015-05-22 MED ORDER — LIDOCAINE HCL (CARDIAC) 20 MG/ML IV SOLN
INTRAVENOUS | Status: DC | PRN
  Administered 2015-05-22: 60 mg via INTRAVENOUS

## 2015-05-22 MED ORDER — ALBUTEROL SULFATE HFA 108 (90 BASE) MCG/ACT IN AERS
INHALATION_SPRAY | RESPIRATORY_TRACT | Status: DC | PRN
  Administered 2015-05-22: 8 via RESPIRATORY_TRACT

## 2015-05-22 MED ORDER — KETOROLAC TROMETHAMINE 15 MG/ML IJ SOLN
15.0000 mg | Freq: Once | INTRAMUSCULAR | Status: AC
  Administered 2015-05-22: 15 mg via INTRAVENOUS

## 2015-05-22 MED ORDER — HEMOSTATIC AGENTS (NO CHARGE) OPTIME
TOPICAL | Status: DC | PRN
  Administered 2015-05-22: 1 via TOPICAL

## 2015-05-22 MED ORDER — MIDAZOLAM HCL 2 MG/2ML IJ SOLN
INTRAMUSCULAR | Status: AC
  Filled 2015-05-22: qty 2

## 2015-05-22 MED ORDER — GLYCOPYRROLATE 0.2 MG/ML IJ SOLN
INTRAMUSCULAR | Status: AC
  Filled 2015-05-22: qty 4

## 2015-05-22 MED ORDER — OXYCODONE HCL 5 MG PO TABS: 5.0000 mg | ORAL_TABLET | Freq: Once | ORAL | Status: DC | PRN

## 2015-05-22 MED ORDER — ACETAMINOPHEN 650 MG RE SUPP: 650.0000 mg | RECTAL | Status: DC | PRN

## 2015-05-22 MED ORDER — DEXAMETHASONE SODIUM PHOSPHATE 4 MG/ML IJ SOLN
INTRAMUSCULAR | Status: DC | PRN
  Administered 2015-05-22: 4 mg via INTRAVENOUS

## 2015-05-22 MED ORDER — LACTATED RINGERS IV SOLN
INTRAVENOUS | Status: DC
  Administered 2015-05-22: 07:00:00 via INTRAVENOUS

## 2015-05-22 MED ORDER — SODIUM CHLORIDE 0.9 % IV SOLN: 250.0000 mL | INTRAVENOUS | Status: DC | PRN

## 2015-05-22 MED ORDER — NEOSTIGMINE METHYLSULFATE 10 MG/10ML IV SOLN
INTRAVENOUS | Status: DC | PRN
  Administered 2015-05-22: 4 mg via INTRAVENOUS

## 2015-05-22 MED ORDER — ARTIFICIAL TEARS OP OINT
TOPICAL_OINTMENT | OPHTHALMIC | Status: AC
  Filled 2015-05-22: qty 3.5

## 2015-05-22 MED ORDER — ONDANSETRON HCL 4 MG/2ML IJ SOLN
INTRAMUSCULAR | Status: AC
  Filled 2015-05-22: qty 4

## 2015-05-22 MED ORDER — PHENYLEPHRINE 40 MCG/ML (10ML) SYRINGE FOR IV PUSH (FOR BLOOD PRESSURE SUPPORT)
PREFILLED_SYRINGE | INTRAVENOUS | Status: AC
  Filled 2015-05-22: qty 10

## 2015-05-22 MED ORDER — LIDOCAINE HCL 4 % MT SOLN
OROMUCOSAL | Status: DC | PRN
  Administered 2015-05-22: 4 mL via TOPICAL

## 2015-05-22 MED ORDER — EPHEDRINE SULFATE 50 MG/ML IJ SOLN
INTRAMUSCULAR | Status: AC
  Filled 2015-05-22: qty 1

## 2015-05-22 SURGICAL SUPPLY — 39 items
BRUSH CYTOL CELLEBRITY 1.5X140 (MISCELLANEOUS) IMPLANT
CANISTER SUCTION 2500CC (MISCELLANEOUS) ×3 IMPLANT
CONT SPEC 4OZ CLIKSEAL STRL BL (MISCELLANEOUS) ×3 IMPLANT
CONT SPECI 4OZ STER CLIK (MISCELLANEOUS) ×6 IMPLANT
COTTONBALL LRG STERILE PKG (GAUZE/BANDAGES/DRESSINGS) IMPLANT
COVER TABLE BACK 60X90 (DRAPES) ×3 IMPLANT
DISSECTOR BLUNT TIP ENDO 5MM (MISCELLANEOUS) ×3 IMPLANT
DRAPE LAPAROSCOPIC ABDOMINAL (DRAPES) ×3 IMPLANT
DRAPE TABLE COVER HEAVY DUTY (DRAPES) ×3 IMPLANT
DRSG TEGADERM 2-3/8X2-3/4 SM (GAUZE/BANDAGES/DRESSINGS) ×3 IMPLANT
FORCEPS BIOP RJ4 1.8 (CUTTING FORCEPS) IMPLANT
FORCEPS RADIAL JAW LRG 4 PULM (INSTRUMENTS) ×1 IMPLANT
GAUZE SPONGE 2X2 8PLY STRL LF (GAUZE/BANDAGES/DRESSINGS) ×1 IMPLANT
GAUZE SPONGE 4X4 12PLY STRL (GAUZE/BANDAGES/DRESSINGS) ×6 IMPLANT
GAUZE SPONGE 4X4 16PLY XRAY LF (GAUZE/BANDAGES/DRESSINGS) ×3 IMPLANT
GLOVE BIO SURGEON STRL SZ7.5 (GLOVE) ×6 IMPLANT
KIT CLEAN ENDO COMPLIANCE (KITS) ×3 IMPLANT
KIT ROOM TURNOVER OR (KITS) ×3 IMPLANT
MARKER SKIN DUAL TIP RULER LAB (MISCELLANEOUS) ×6 IMPLANT
NEEDLE BIOPSY TRANSBRONCH 21G (NEEDLE) IMPLANT
NEEDLE BLUNT 18X1 FOR OR ONLY (NEEDLE) IMPLANT
NEEDLE HYPO 22GX1.5 SAFETY (NEEDLE) IMPLANT
NS IRRIG 1000ML POUR BTL (IV SOLUTION) ×3 IMPLANT
OIL SILICONE PENTAX (PARTS (SERVICE/REPAIRS)) ×3 IMPLANT
PAD ARMBOARD 7.5X6 YLW CONV (MISCELLANEOUS) ×6 IMPLANT
RADIAL JAW LRG 4 PULMONARY (INSTRUMENTS) ×2
SPONGE GAUZE 2X2 STER 10/PKG (GAUZE/BANDAGES/DRESSINGS) ×2
SPONGE INTESTINAL PEANUT (DISPOSABLE) ×3 IMPLANT
SUT SILK 3 0 (SUTURE) ×2
SUT SILK 3-0 18XBRD TIE 12 (SUTURE) ×1 IMPLANT
SUT VIC AB 3-0 SH 8-18 (SUTURE) ×6 IMPLANT
SUT VICRYL 4-0 PS2 18IN ABS (SUTURE) ×3 IMPLANT
SYR 20ML ECCENTRIC (SYRINGE) ×6 IMPLANT
SYR 5ML LUER SLIP (SYRINGE) ×3 IMPLANT
SYR CONTROL 10ML LL (SYRINGE) ×3 IMPLANT
TOWEL OR 17X24 6PK STRL BLUE (TOWEL DISPOSABLE) ×3 IMPLANT
TRAP SPECIMEN MUCOUS 40CC (MISCELLANEOUS) ×6 IMPLANT
TUBE CONNECTING 20'X1/4 (TUBING) ×2
TUBE CONNECTING 20X1/4 (TUBING) ×4 IMPLANT

## 2015-05-22 NOTE — Transfer of Care (Signed)
Immediate Anesthesia Transfer of Care Note  Patient: Brandon [name]  Procedure(s) Performed: Procedure(s): VIDEO BRONCHSCOPY/MEDIASTINSCOPY (N/A)  Patient Location: PACU  Anesthesia Type:General  Level of Consciousness: awake, alert  and oriented  Airway & Oxygen Therapy: Patient Spontanous Breathing and Patient connected to nasal cannula oxygen  Post-op Assessment: Report given to RN and Post -op Vital signs reviewed and stable  Post vital signs: Reviewed and stable  Last Vitals:  Filed Vitals:   05/22/15 0644  BP: 112/88  Pulse: 83  Temp: 36.4 C  Resp: 16    Complications: No apparent anesthesia complications   Patient denies pain

## 2015-05-22 NOTE — Anesthesia Postprocedure Evaluation (Signed)
  Anesthesia Post-op Note  Patient: Brandon [name]  Procedure(s) Performed: Procedure(s): VIDEO BRONCHSCOPY/MEDIASTINSCOPY (N/A)  Patient Location: PACU  Anesthesia Type:General  Level of Consciousness: awake  Airway and Oxygen Therapy: Patient Spontanous Breathing  Post-op Pain: none  Post-op Assessment: Post-op Vital signs reviewed, Patient's Cardiovascular Status Stable, Respiratory Function Stable, Patent Airway, No signs of Nausea or vomiting and Pain level controlled              Post-op Vital Signs: Reviewed and stable  Last Vitals:  Filed Vitals:   05/22/15 1148  BP:   Pulse:   Temp: 36.7 C  Resp:     Complications: No apparent anesthesia complications

## 2015-05-22 NOTE — Op Note (Signed)
NAMEREINER, LOEWEN NO.:  [id number]  MEDICAL RECORD NO.:  [id number]  LOCATION:  [facility]                         FACILITY:  [facility]  PHYSICIAN:  Kerin Perna, M.D.  DATE OF BIRTH:  08/09/1961  DATE OF PROCEDURE:  05/22/2015 DATE OF DISCHARGE:  05/22/2015                              OPERATIVE REPORT   OPERATIONS: 1. Video bronchoscopy. 2. Mediastinoscopy and biopsy of 4R level mediastinal nodes.  PREOPERATIVE DIAGNOSIS:  Mediastinal adenopathy with cough and fever.  POSTOPERATIVE DIAGNOSIS:  Mediastinal adenopathy with cough and fever.  SURGEON:  Kerin Perna, M.D.  ANESTHESIA:  General by Dr. Val Eagle.  CLINICAL NOTE:  The patient is a 54 year old African American male, nonsmoker, who was referred for evaluation of mediastinal adenopathy documented on CT scan associated with clinical symptoms of fever, night sweats and dry cough.  There were no discrete pulmonary masses seen on CT scan.  He was seen at the Cancer Clinic in [place] at [facility] and referred for thoracic surgical evaluation for mediastinal lymph node biopsy.  I reviewed the results of the CT scan with the patient and discussed the procedure, bronchoscopy and mediastinoscopy and lymph node biopsy with the patient.  I discussed the indications, benefits, alternatives, and risks including the risks of bleeding, pneumothorax, bleeding, and wound infection.  He understood the procedure and the reasons for the procedure and the risks of the procedure and agreed to proceed with the surgery.  DESCRIPTION OF PROCEDURE:  The patient was brought to the operating room and placed supine on the operating table.  General anesthesia was induced.  A proper time-out was performed.  Through the endotracheal tube, a fiberoptic bronchoscope was passed.  The distal trachea and carina were normal.  The bronchoscope was passed on the right mainstem bronchus, which was normal.  The  endobronchial segments of the right lower lobe, right middle lobe and right upper lobe were visualized and there were no abnormalities.  Washings were sent for culture as well as AFB fungal analysis and washings were sent for cytology.  The bronchoscope was passed on the left mainstem bronchus.  There were no endobronchial abnormalities of the left bronchus or the endobronchial segments of the left upper lobe and left lower lobe.  The bronchoscope was withdrawn.  The neck and chest were then prepped and draped as a sterile field.  A proper time-out was performed.  Small incision was made above this suprasternal notch.  Dissection was carried down to the pretracheal plane.  Dissection was carefully carried out underneath the innominate artery along the anterior surface of the trachea.  The scope was then inserted.  The 4R lymph node station was identified after carefully dissecting further distally.  Multiple pieces using the bioptome were removed and sent for pathology.  I aspirated the area that was biopsied with a needle to make sure this was not a blood vessel.  Bleeding was controlled with direct pressure and a small piece of Surgicel was placed over the lymph node to control topical bleeding.  This was observed and hemostasis was achieved.  The incision was closed with interrupted 3-0 Vicryl for the platysma. The subcutaneous layers were closed  with a running 3-0 Vicryl and the skin was closed with a subcuticular suture.  Sterile dressings were applied.  The patient was reversed from anesthesia, extubated and returned to the recovery room in stable condition.  ADDENDUM:  Frozen section specimen was sent to Pathology or recommended to be held by the pathologist rather than used for frozen section because of artifactual changes associated with frozen section preparation, so no frozen section report was available at the end of the procedure.     Kerin Perna,  M.D.     PV/MEDQ  D:  05/22/2015  T:  05/22/2015  Job:  [id number]

## 2015-05-22 NOTE — Anesthesia Procedure Notes (Signed)
Procedure Name: Intubation Date/Time: 05/22/2015 8:23 AM Performed by: Merdis Delay Pre-anesthesia Checklist: Patient identified, Timeout performed, Emergency Drugs available, Suction available and Patient being monitored Patient Re-evaluated:Patient Re-evaluated prior to inductionOxygen Delivery Method: Circle system utilized Preoxygenation: Pre-oxygenation with 100% oxygen Intubation Type: IV induction Ventilation: Mask ventilation without difficulty and Oral airway inserted - appropriate to patient size Laryngoscope Size: Mac and 3 Grade View: Grade I Tube type: Oral Tube size: 9.0 mm Number of attempts: 1 Airway Equipment and Method: Stylet and LTA kit utilized Placement Confirmation: ETT inserted through vocal cords under direct vision,  breath sounds checked- equal and bilateral,  positive ETCO2 and CO2 detector Secured at: 24 cm Tube secured with: Tape Dental Injury: Teeth and Oropharynx as per pre-operative assessment

## 2015-05-22 NOTE — Anesthesia Preprocedure Evaluation (Signed)
Anesthesia Evaluation  Patient identified by MRN, date of birth, ID band Patient awake    Reviewed: Allergy & Precautions, NPO status , Patient's Chart, lab work & pertinent test results  Airway Mallampati: II  TM Distance: >3 FB Neck ROM: Full    Dental  (+) Teeth Intact, Missing,    Pulmonary neg pulmonary ROS,    - wheezing  rales    Cardiovascular negative cardio ROS  Rhythm:Regular     Neuro/Psych  Headaches, PSYCHIATRIC DISORDERS Depression    GI/Hepatic Neg liver ROS, GERD-  Medicated and Controlled,  Endo/Other  negative endocrine ROS  Renal/GU negative Renal ROS     Musculoskeletal   Abdominal   Peds  Hematology negative hematology ROS (+)   Anesthesia Other Findings   Reproductive/Obstetrics                             Anesthesia Physical Anesthesia Plan  ASA: II  Anesthesia Plan: General   Post-op Pain Management:    Induction: Intravenous  Airway Management Planned: Oral ETT  Additional Equipment: None  Intra-op Plan:   Post-operative Plan: Extubation in OR  Informed Consent: I have reviewed the patients History and Physical, chart, labs and discussed the procedure including the risks, benefits and alternatives for the proposed anesthesia with the patient or authorized representative who has indicated his/her understanding and acceptance.   Dental advisory given  Plan Discussed with: CRNA and Surgeon  Anesthesia Plan Comments:         Anesthesia Quick Evaluation

## 2015-05-22 NOTE — Progress Notes (Signed)
The patient was examined and preop studies reviewed. There has been no change from the prior exam and the patient is ready for surgery.  Plan bronchoscopy and mediastinoscopy on M [place]

## 2015-05-22 NOTE — Brief Op Note (Signed)
05/22/2015  10:11 AM  PATIENT:  Brandon [name]  54 y.o. male  PRE-OPERATIVE DIAGNOSIS:  MEDIASTINAL ADENOPATHY  POST-OPERATIVE DIAGNOSIS:  MEDIASTINAL ADENOPATHY  PROCEDURE:  Procedure(s): VIDEO BRONCHSCOPY/MEDIASTINSCOPY (N/A) Biopsies of 4 R lymph nodes  SURGEON:  Surgeon(s) and Rolpe:    * Kerin Perna, MD - Primary  PHYSICIAN ASSISTANT:   ASSISTANTS: none   ANESTHESIA:   general  EBL:  Total I/O In: 950 [I.V.:950] Out: -   BLOOD ADMINISTERED:none  DRAINS: none   LOCAL MEDICATIONS USED:  NONE  SPECIMEN:  Biopsy / Limited Resection  DISPOSITION OF SPECIMEN:  PATHOLOGY for nodes.   Microbiology and cytopathology for bronchial washings  COUNTS:  YES  TOURNIQUET:  * No tourniquets in log *  DICTATION: .Dragon Dictation  PLAN OF CARE: Discharge to home after PACU  PATIENT DISPOSITION:  PACU - hemodynamically stable.   Delay start of Pharmacological VTE agent (>24hrs) due to surgical blood loss or risk of bleeding: yes

## 2015-05-23 ENCOUNTER — Encounter (HOSPITAL_COMMUNITY): Payer: Self-pay | Admitting: Cardiothoracic Surgery

## 2015-05-23 ENCOUNTER — Other Ambulatory Visit: Payer: PRIVATE HEALTH INSURANCE | Admitting: Cardiothoracic Surgery

## 2015-05-23 DIAGNOSIS — R590 Localized enlarged lymph nodes: Secondary | ICD-10-CM

## 2015-05-24 LAB — CULTURE, RESPIRATORY W GRAM STAIN

## 2015-05-27 ENCOUNTER — Encounter (HOSPITAL_COMMUNITY): Payer: Self-pay | Admitting: Hematology & Oncology

## 2015-05-27 ENCOUNTER — Encounter: Payer: Self-pay | Admitting: Cardiothoracic Surgery

## 2015-05-27 ENCOUNTER — Encounter (HOSPITAL_COMMUNITY): Payer: PRIVATE HEALTH INSURANCE | Attending: Hematology & Oncology | Admitting: Hematology & Oncology

## 2015-05-27 ENCOUNTER — Ambulatory Visit (INDEPENDENT_AMBULATORY_CARE_PROVIDER_SITE_OTHER): Payer: PRIVATE HEALTH INSURANCE | Admitting: Cardiothoracic Surgery

## 2015-05-27 ENCOUNTER — Encounter (HOSPITAL_COMMUNITY): Payer: Self-pay | Admitting: Lab

## 2015-05-27 VITALS — BP 104/74 | HR 97 | Temp 98.1°F | Resp 18 | Wt 140.9 lb

## 2015-05-27 VITALS — BP 106/69 | HR 108 | Resp 16 | Ht 70.0 in | Wt 140.0 lb

## 2015-05-27 DIAGNOSIS — R599 Enlarged lymph nodes, unspecified: Secondary | ICD-10-CM | POA: Insufficient documentation

## 2015-05-27 DIAGNOSIS — R5383 Other fatigue: Secondary | ICD-10-CM | POA: Insufficient documentation

## 2015-05-27 DIAGNOSIS — I888 Other nonspecific lymphadenitis: Secondary | ICD-10-CM

## 2015-05-27 DIAGNOSIS — F329 Major depressive disorder, single episode, unspecified: Secondary | ICD-10-CM | POA: Insufficient documentation

## 2015-05-27 DIAGNOSIS — I881 Chronic lymphadenitis, except mesenteric: Secondary | ICD-10-CM

## 2015-05-27 DIAGNOSIS — R590 Localized enlarged lymph nodes: Secondary | ICD-10-CM

## 2015-05-27 DIAGNOSIS — K219 Gastro-esophageal reflux disease without esophagitis: Secondary | ICD-10-CM | POA: Insufficient documentation

## 2015-05-27 DIAGNOSIS — R634 Abnormal weight loss: Secondary | ICD-10-CM | POA: Insufficient documentation

## 2015-05-27 DIAGNOSIS — R05 Cough: Secondary | ICD-10-CM | POA: Insufficient documentation

## 2015-05-27 DIAGNOSIS — Z79899 Other long term (current) drug therapy: Secondary | ICD-10-CM | POA: Insufficient documentation

## 2015-05-27 DIAGNOSIS — Z9889 Other specified postprocedural states: Secondary | ICD-10-CM

## 2015-05-27 DIAGNOSIS — M549 Dorsalgia, unspecified: Secondary | ICD-10-CM | POA: Insufficient documentation

## 2015-05-27 DIAGNOSIS — G8929 Other chronic pain: Secondary | ICD-10-CM | POA: Insufficient documentation

## 2015-05-27 DIAGNOSIS — R591 Generalized enlarged lymph nodes: Secondary | ICD-10-CM | POA: Diagnosis not present

## 2015-05-27 NOTE — Progress Notes (Signed)
No PCP Per Patient No address on file  Mediastinal adenopathy, positive on PET CT Video bronchoscopy and mediastinoscopy and biopsy of 4R level mediastinal nodes by Dr. Kathlee Nations Trigt MD Granulomatous Lymphadenitis   CURRENT THERAPY: Observation  INTERVAL HISTORY: Brandon [name] 54 y.o. male returns for followup of intrathoracic adenopathy, pathologic, s/p biopsy and final path.   The patiently has recently had an appointment with Dr. Donata Clay. He only has complaints of coughing up thicker phlegm which has been affecting his breathing however, he has not been coughing as much. He otherwise has fatigue. He is here today for additional recommendations.   Past Medical History  Diagnosis Date  . Migraines   . Chronic back pain   . Acid reflux   . Adenopathy 05/12/2015  . Depression     pt denies  . Constipation     has Migraine headache; Abdominal pain, unspecified site; Microscopic hematuria; Renal insufficiency; Hyperlipidemia; Mild acid reflux; Dysphagia, unspecified(787.20); and Adenopathy on his problem list.     has No Known Allergies.  Current Outpatient Prescriptions on File Prior to Visit  Medication Sig Dispense Refill  . acetaminophen (TYLENOL) 325 MG tablet Take 650 mg by mouth every 6 (six) hours as needed for headache.     No current facility-administered medications on file prior to visit.    Past Surgical History  Procedure Laterality Date  . No past surgeries    . Colonoscopy  11/06/2012    ZHY:QMVH diverticulosis/Small internal hemorrhoids otherwise normal  . Esophagogastroduodenoscopy (egd) with esophageal dilation N/A 01/16/2013    Procedure: ESOPHAGOGASTRODUODENOSCOPY (EGD) WITH ESOPHAGEAL DILATION;  Surgeon: West Bali, MD;  [facility];  Service: Endoscopy;  Laterality: N/A;  12:15-moved to 10:20am [name] to notify pt  . Video bronchscopy/mediastinscopy N/A 05/22/2015    Procedure: VIDEO BRONCHSCOPY/MEDIASTINSCOPY;   Surgeon: Kerin Perna, MD;  Location: [facility] OR;  Service: Thoracic;  Laterality: N/A;    Denies any headaches, dizziness, double vision, nausea, vomiting, diarrhea, constipation, chest pain, heart palpitations, blood in stool, black tarry stool, urinary pain, urinary burning, urinary frequency, hematuria. 14 point review of systems was performed and is negative except as detailed under history of present illness and above    PHYSICAL EXAMINATION  ECOG PERFORMANCE STATUS: 1 - Symptomatic but completely ambulatory  Filed Vitals:   05/27/15 1027  BP: 104/74  Pulse: 97  Temp: 98.1 F (36.7 C)  Resp: 18   Physical Exam  Constitutional: He is oriented to person, place, and time and well-developed, well-nourished, and in no distress.  HENT:  Head: Normocephalic and atraumatic.  Nose: Nose normal.  Mouth/Throat: Oropharynx is clear and moist. No oropharyngeal exudate.  Eyes: Conjunctivae and EOM are normal. Pupils are equal, round, and reactive to light. Right eye exhibits no discharge. Left eye exhibits no discharge. No scleral icterus.  Neck: Normal range of motion. Neck supple. No tracheal deviation present. No thyromegaly present.  Cardiovascular: Normal rate, regular rhythm and normal heart sounds.  Exam reveals no gallop and no friction rub.   No murmur heard. Pulmonary/Chest: Effort normal and breath sounds normal. He has no wheezes. He has no rales.  Abdominal: Soft. Bowel sounds are normal. He exhibits no distension and no mass. There is no tenderness. There is no rebound and no guarding.  Musculoskeletal: Normal range of motion. He exhibits no edema.  Lymphadenopathy:    He has no cervical adenopathy.  Neurological: He is alert and  oriented to person, place, and time. He has normal reflexes. No cranial nerve deficit. Gait normal. Coordination normal.  Skin: Skin is warm and dry. No rash noted.  Psychiatric: Mood, memory, affect and judgment normal.  Nursing note and vitals  reviewed.   LABORATORY DATA: CBC    Component Value Date/Time   WBC 6.6 05/22/2015 1045   RBC 4.82 05/22/2015 1045   HGB 14.3 05/22/2015 1045   HCT 42.8 05/22/2015 1045   PLT 274 05/22/2015 1045   MCV 88.8 05/22/2015 1045   MCH 29.7 05/22/2015 1045   MCHC 33.4 05/22/2015 1045   RDW 12.2 05/22/2015 1045   LYMPHSABS 1.1 05/01/2015 1000   MONOABS 0.7 05/01/2015 1000   EOSABS 0.2 05/01/2015 1000   BASOSABS 0.0 05/01/2015 1000      Chemistry      Component Value Date/Time   NA 138 05/22/2015 1045   K 4.3 05/22/2015 1045   CL 105 05/22/2015 1045   CO2 28 05/22/2015 1045   BUN 10 05/22/2015 1045   CREATININE 1.40* 05/22/2015 1045   CREATININE 1.09 12/21/2012 1645      Component Value Date/Time   CALCIUM 8.9 05/22/2015 1045   ALKPHOS 90 05/22/2015 1045   AST 47* 05/22/2015 1045   ALT 43 05/22/2015 1045   BILITOT 0.7 05/22/2015 1045     RADIOGRAPHIC STUDIES:  No results found.   PATHOLOGY: REPORT OF SURGICAL PATHOLOGYINAL DIAGNOSIS Diagnosis 1. Lymph node for lymphoma, 4R - GRANULOMATOUS LYMPHADENITIS. - SEE COMMENT. 2. Lymph node for lymphoma, 4R - GRANULOMATOUS LYMPHADENITIS. - SEE COMMENT. Microscopic Comment 1. , and 2. Both of the lymph nodes are essentially replaced by numerous variably sized epithelioid granulomata associated with scattering of multinucleated giant cells. Significant necrosis is not present. The lymphoid population in the background is very scanty and mostly consisting of small lymphoid cells. Special stains for microorganisms including AFB and GMS are negative. In this setting, main consideration should be given to sarcoidosis. Clinical correlation is recommended. (BNS:ds 05/26/15) BASSAM SMIR   ASSESSMENT AND PLAN:  Mediastinal adenopathy PET/CT with no discrete pulmonary mass but significant paratracheal right hilar and subcarinal adenopathy Biopsy with granulomatous lymphadenitis  At this point I see no evidence of infection or  lymphoma. Granulomatous lymphadenitis can have infectious or noninfectious causes. Per reading sarcoidosis is a strong possibility.  We discussed referral to a pulmonologist. The patient prefers to be seen in [place] close to his home. I am unfamiliar with pulmonary in [place] but we will make a referral to the local pulmonary practice there.  I have advised the patient to call us if he has a need for [place]. I have advised him to call with any questions problems or concerns. Follow-up prn.   All questions were answered. The patient knows to call the clinic with any problems, questions or concerns. We can certainly see the patient much sooner if necessary.   This document serves as a record of services personally performed by Loma Messing, MD. It was created on her behalf by Suzi Roots, a trained medical scribe. The creation of this record is based on the scribe's personal observations and the provider's statements to them. This document has been checked and approved by the attending provider.  I have reviewed the above documentation for accuracy and completeness, and I agree with the above.  This note was electronically signed.  Allene Pyo, MD

## 2015-05-27 NOTE — Progress Notes (Signed)
Referral sent to [facility]monary.  appt 8/4.  Patient aware of appt.

## 2015-05-27 NOTE — Patient Instructions (Signed)
[facility] at [facility] Discharge Instructions  RECOMMENDATIONS MADE BY THE CONSULTANT AND ANY TEST RESULTS WILL BE SENT TO YOUR REFERRING PHYSICIAN.  Exam and discussion by Dr. Galen Manila. Biopsy was negative for cancer but suggested possibility of Sarcoidosis. Will make referral to pulmonologist in [place].   Call with any concerns. We will not make a follow-up appointment for here.  Thank you for choosing [facility] at [facility] to provide your oncology and hematology care.  To afford each patient quality time with our provider, please arrive at least 15 minutes before your scheduled appointment time.    You need to re-schedule your appointment should you arrive 10 or more minutes late.  We strive to give you quality time with our providers, and arriving late affects you and other patients whose appointments are after yours.  Also, if you no show three or more times for appointments you may be dismissed from the clinic at the providers discretion.     Again, thank you for choosing [facility].  Our hope is that these requests will decrease the amount of time that you wait before being seen by our physicians.       _____________________________________________________________  Should you have questions after your visit to [facility], please contact our office at [phone] between the hours of 8:30 a.m. and 4:30 p.m.  Voicemails left after 4:30 p.m. will not be returned until the following business day.  For prescription refill requests, have your pharmacy contact our office.

## 2015-05-27 NOTE — Progress Notes (Signed)
PCP is No PCP Per Patient Referring Provider is Kefalas, Maurine Minister, PA-C  Chief Complaint  Patient presents with  . Routine Post Op    s/p BRONCH/MEDIASTINOSCOPY.. 05/22/15    JYN:WGNFAOZ returns for discussion after bronchoscopy and mediastinoscopy 4 days ago. Patient has significant mediastinal adenopathy cough and some low-grade fever. Bronchoscopy washings were negative. 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 He will referred to pulmonary medicine.  No problems with the surgical incision. Patient is back at work. He is a nonsmoker.  Past Medical History  Diagnosis Date  . Migraines   . Chronic back pain   . Acid reflux   . Adenopathy 05/12/2015  . Depression     pt denies  . Constipation     Past Surgical History  Procedure Laterality Date  . No past surgeries    . Colonoscopy  11/06/2012    HYQ:MVHQ diverticulosis/Small internal hemorrhoids otherwise normal  . Esophagogastroduodenoscopy (egd) with esophageal dilation N/A 01/16/2013    Procedure: ESOPHAGOGASTRODUODENOSCOPY (EGD) WITH ESOPHAGEAL DILATION;  Surgeon: West Bali, MD;  [facility];  Service: Endoscopy;  Laterality: N/A;  12:15-moved to 10:20am [name] to notify pt  . Video bronchscopy/mediastinscopy N/A 05/22/2015    Procedure: VIDEO BRONCHSCOPY/MEDIASTINSCOPY;  Surgeon: Kerin Perna, MD;  Location: [facility] OR;  Service: Thoracic;  Laterality: N/A;    Family History  Problem Relation Age of Onset  . Diabetes Father   . Hypertension Brother   . Colon cancer Other     Social History History  Substance Use Topics  . Smoking status: Never Smoker   . Smokeless tobacco: Never Used  . Alcohol Use: No    Current Outpatient Prescriptions  Medication Sig Dispense Refill  . acetaminophen (TYLENOL) 325 MG tablet Take 650 mg by mouth every 6 (six) hours as needed for headache.    . albuterol (PROVENTIL HFA;VENTOLIN HFA) 108 (90 BASE) MCG/ACT inhaler  Inhale 2 puffs into the lungs every 6 (six) hours as needed for wheezing or shortness of breath.    Marland Kitchen HYDROcodone-acetaminophen (NORCO/VICODIN) 5-325 MG per tablet Take 1 tablet by mouth as needed.     No current facility-administered medications for this visit.    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   Mikey Bussing, MD [facility] [phone]

## 2015-05-28 ENCOUNTER — Encounter: Payer: Self-pay | Admitting: *Deleted

## 2015-05-28 NOTE — Progress Notes (Signed)
Patient ID: Brandon [name], male   DOB: Dec 11, 1960, 54 y.o.   MRN: [id number] Mr. Osborn saw Dr. Donata Clay yesterday to discuss path results after his bronch/mediastinoscopy.  Dr. Donata Clay ordered referral to a local pulmonologist for treatment.  Mr. Gan referring doctor has referred him to a pulmonologist where he lives, but he failed to tell Dr. Donata Clay. I will inform Dr. Donata Clay.

## 2015-06-11 ENCOUNTER — Ambulatory Visit: Payer: PRIVATE HEALTH INSURANCE | Admitting: Cardiothoracic Surgery

## 2015-06-18 LAB — FUNGUS CULTURE W SMEAR: Fungal Smear: NONE SEEN

## 2016-04-04 IMAGING — DX DG CHEST 1V PORT
1 series · 1 of 1 positions shown · non-contrast
Comparison: 05/22/2015

CLINICAL DATA: Status post bronchoscopy

EXAM:
PORTABLE CHEST - 1 VIEW

[chest ap]
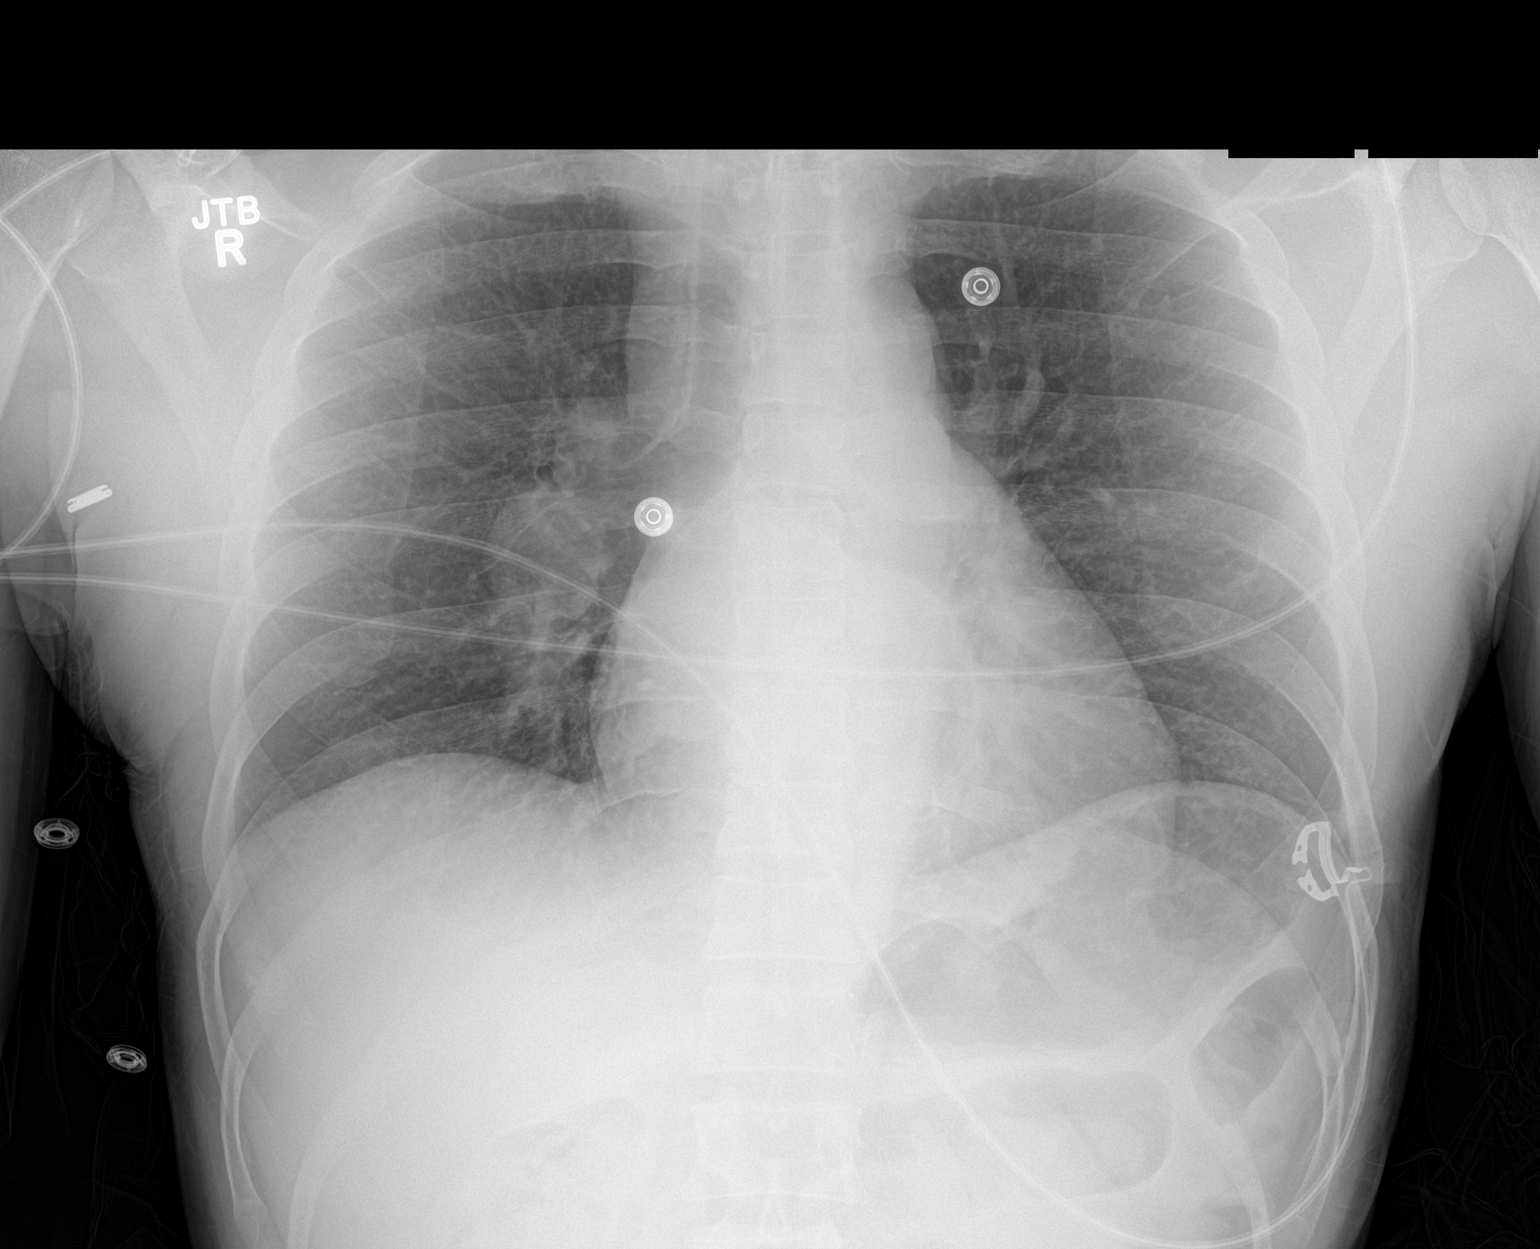

[1 of 1 positions shown; findings below may reference images not displayed]

FINDINGS: Cardiac shadow is stable. Persistent right hilar prominence is noted
consistent with the known history of lymphadenopathy. No
pneumothorax is noted. No focal infiltrate is seen.
IMPRESSION: Persistent right hilar adenopathy. No pneumothorax is noted
following bronchoscopy.
